# Patient Record
Sex: Male | Born: 1985 | Race: Black or African American | Hispanic: No | Marital: Single | Smoking: Former smoker
Health system: Southern US, Community
[De-identification: ages and names within clinical notes are randomized; demographics above are authoritative.]

## PROBLEM LIST (undated history)

## (undated) DIAGNOSIS — C801 Malignant (primary) neoplasm, unspecified: Secondary | ICD-10-CM

## (undated) DIAGNOSIS — N189 Chronic kidney disease, unspecified: Secondary | ICD-10-CM

## (undated) HISTORY — PX: TONSILLECTOMY: SUR1361

## (undated) HISTORY — PX: APPENDECTOMY: SHX54

---

## 2014-05-19 ENCOUNTER — Encounter (HOSPITAL_COMMUNITY): Payer: Self-pay | Admitting: Emergency Medicine

## 2014-05-19 ENCOUNTER — Emergency Department (HOSPITAL_COMMUNITY)
Admission: EM | Admit: 2014-05-19 | Discharge: 2014-05-19 | Disposition: A | Discharge status: Home / Self Care | Payer: 59 | Attending: Emergency Medicine | Admitting: Emergency Medicine

## 2014-05-19 ENCOUNTER — Emergency Department (HOSPITAL_COMMUNITY): Payer: 59

## 2014-05-19 DIAGNOSIS — M25572 Pain in left ankle and joints of left foot: Secondary | ICD-10-CM | POA: Insufficient documentation

## 2014-05-19 MED ORDER — OXYCODONE-ACETAMINOPHEN 5-325 MG PO TABS
1.0000 | ORAL_TABLET | Freq: Once | ORAL | Status: AC
Start: 2014-05-19 — End: 2014-05-19
  Administered 2014-05-19: 1 via ORAL
  Filled 2014-05-19: qty 1

## 2014-05-19 MED ORDER — IBUPROFEN 800 MG PO TABS: 800.0000 mg | ORAL_TABLET | Freq: Three times a day (TID) | ORAL | Status: DC

## 2014-05-19 MED ORDER — TRAMADOL HCL 50 MG PO TABS: 50.0000 mg | ORAL_TABLET | Freq: Four times a day (QID) | ORAL | Status: DC | PRN

## 2014-05-19 NOTE — Discharge Instructions (Signed)
Ankle Pain Ankle pain is a common symptom. The bones, cartilage, tendons, and muscles of the ankle joint perform a lot of work each day. The ankle joint holds your body weight and allows you to move around. Ankle pain can occur on either side or back of 1 or both ankles. Ankle pain may be sharp and burning or dull and aching. There may be tenderness, stiffness, redness, or warmth around the ankle. The pain occurs more often when a person walks or puts pressure on the ankle. CAUSES  There are many reasons ankle pain can develop. It is important to work with your caregiver to identify the cause since many conditions can impact the bones, cartilage, muscles, and tendons. Causes for ankle pain include:  Injury, including a break (fracture), sprain, or strain often due to a fall, sports, or a high-impact activity.  Swelling (inflammation) of a tendon (tendonitis).  Achilles tendon rupture.  Ankle instability after repeated sprains and strains.  Poor foot alignment.  Pressure on a nerve (tarsal tunnel syndrome).  Arthritis in the ankle or the lining of the ankle.  Crystal formation in the ankle (gout or pseudogout). DIAGNOSIS  A diagnosis is based on your medical history, your symptoms, results of your physical exam, and results of diagnostic tests. Diagnostic tests may include X-ray exams or a computerized magnetic scan (magnetic resonance imaging, MRI). TREATMENT  Treatment will depend on the cause of your ankle pain and may include:  Keeping pressure off the ankle and limiting activities.  Using crutches or other walking support (a cane or brace).  Using rest, ice, compression, and elevation.  Participating in physical therapy or home exercises.  Wearing shoe inserts or special shoes.  Losing weight.  Taking medications to reduce pain or swelling or receiving an injection.  Undergoing surgery. HOME CARE INSTRUCTIONS   Only take over-the-counter or prescription medicines for  pain, discomfort, or fever as directed by your caregiver.  Put ice on the injured area.  Put ice in a plastic bag.  Place a towel between your skin and the bag.  Leave the ice on for 15-20 minutes at a time, 03-04 times a day.  Keep your leg raised (elevated) when possible to lessen swelling.  Avoid activities that cause ankle pain.  Follow specific exercises as directed by your caregiver.  Record how often you have ankle pain, the location of the pain, and what it feels like. This information may be helpful to you and your caregiver.  Ask your caregiver about returning to work or sports and whether you should drive.  Follow up with your caregiver for further examination, therapy, or testing as directed. SEEK MEDICAL CARE IF:   Pain or swelling continues or worsens beyond 1 week.  You have an oral temperature above 102 F (38.9 C).  You are feeling unwell or have chills.  You are having an increasingly difficult time with walking.  You have loss of sensation or other new symptoms.  You have questions or concerns. MAKE SURE YOU:   Understand these instructions.  Will watch your condition.  Will get help right away if you are not doing well or get worse. Document Released: 06/28/2009 Document Revised: 04/02/2011 Document Reviewed: 06/28/2009 [facility] Patient Information 2015 [place], [place]. This information is not intended to replace advice given to you by your health care provider. Make sure you discuss any questions you have with your health care provider. Cryotherapy Cryotherapy means treatment with cold. Ice or gel packs can be used to  reduce both pain and swelling. Ice is the most helpful within the first 24 to 48 hours after an injury or flare-up from overusing a muscle or joint. Sprains, strains, spasms, burning pain, shooting pain, and aches can all be eased with ice. Ice can also be used when recovering from surgery. Ice is effective, has very few side effects,  and is safe for most people to use. PRECAUTIONS  Ice is not a safe treatment option for people with:  Raynaud phenomenon. This is a condition affecting small blood vessels in the extremities. Exposure to cold may cause your problems to return.  Cold hypersensitivity. There are many forms of cold hypersensitivity, including:  Cold urticaria. Red, itchy hives appear on the skin when the tissues begin to warm after being iced.  Cold erythema. This is a red, itchy rash caused by exposure to cold.  Cold hemoglobinuria. Red blood cells break down when the tissues begin to warm after being iced. The hemoglobin that carry oxygen are passed into the urine because they cannot combine with blood proteins fast enough.  Numbness or altered sensitivity in the area being iced. If you have any of the following conditions, do not use ice until you have discussed cryotherapy with your caregiver:  Heart conditions, such as arrhythmia, angina, or chronic heart disease.  High blood pressure.  Healing wounds or open skin in the area being iced.  Current infections.  Rheumatoid arthritis.  Poor circulation.  Diabetes. Ice slows the blood flow in the region it is applied. This is beneficial when trying to stop inflamed tissues from spreading irritating chemicals to surrounding tissues. However, if you expose your skin to cold temperatures for too long or without the proper protection, you can damage your skin or nerves. Watch for signs of skin damage due to cold. HOME CARE INSTRUCTIONS Follow these tips to use ice and cold packs safely.  Place a dry or damp towel between the ice and skin. A damp towel will cool the skin more quickly, so you may need to shorten the time that the ice is used.  For a more rapid response, add gentle compression to the ice.  Ice for no more than 10 to 20 minutes at a time. The bonier the area you are icing, the less time it will take to get the benefits of ice.  Check  your skin after 5 minutes to make sure there are no signs of a poor response to cold or skin damage.  Rest 20 minutes or more between uses.  Once your skin is numb, you can end your treatment. You can test numbness by very lightly touching your skin. The touch should be so light that you do not see the skin dimple from the pressure of your fingertip. When using ice, most people will feel these normal sensations in this order: cold, burning, aching, and numbness.  Do not use ice on someone who cannot communicate their responses to pain, such as small children or people with dementia. HOW TO MAKE AN ICE PACK Ice packs are the most common way to use ice therapy. Other methods include ice massage, ice baths, and cryosprays. Muscle creams that cause a cold, tingly feeling do not offer the same benefits that ice offers and should not be used as a substitute unless recommended by your caregiver. To make an ice pack, do one of the following:  Place crushed ice or a bag of frozen vegetables in a sealable plastic bag. Squeeze out the excess  air. Place this bag inside another plastic bag. Slide the bag into a pillowcase or place a damp towel between your skin and the bag.  Mix 3 parts water with 1 part rubbing alcohol. Freeze the mixture in a sealable plastic bag. When you remove the mixture from the freezer, it will be slushy. Squeeze out the excess air. Place this bag inside another plastic bag. Slide the bag into a pillowcase or place a damp towel between your skin and the bag. SEEK MEDICAL CARE IF:  You develop white spots on your skin. This may give the skin a blotchy (mottled) appearance.  Your skin turns blue or pale.  Your skin becomes waxy or hard.  Your swelling gets worse. MAKE SURE YOU:   Understand these instructions.  Will watch your condition.  Will get help right away if you are not doing well or get worse. Document Released: 09/04/2010 Document Revised: 05/25/2013 Document  Reviewed: 09/04/2010 [facility] Patient Information 2015 [place], [place]. This information is not intended to replace advice given to you by your health care provider. Make sure you discuss any questions you have with your health care provider.

## 2014-05-19 NOTE — ED Provider Notes (Signed)
CSN: [id number]     Arrival date & time 05/19/14  2013 History  This chart was scribed for non-physician provider Charlann Lange, PA-C, working with Leonard Schwartz, MD by Irene Pap, ED Scribe. This patient was seen in room [facility] and patient care was started at 9:37 PM.    Chief Complaint  Patient presents with  . Ankle Pain  . Foot Pain   The history is provided by the patient. No language interpreter was used.    HPI Comments: Rick Thomas is a 29 y.o. male who presents to the Emergency Department complaining of left ankle pain onset 3 weeks ago. He states that the pain radiates from the dorsal surface of his foot to the Achilles tendon. He reports that when the pain is worst, it radiates up through his left calf. He reports that the pain is increasingly getting worse, to the point where he cannot walk and it interferes with his work. He states that the foot intermittently swells and hurts throughout the day. He states that he has taken ibuprofen and tylenol to no relief. He denies any recent injury that could have caused this pain. He denies any significant medical history.   No past medical history on file. No past surgical history on file. No family history on file. History  Substance Use Topics  . Smoking status: Not on file  . Smokeless tobacco: Not on file  . Alcohol Use: Not on file    Review of Systems  Constitutional: Negative for fever and chills.  Gastrointestinal: Negative for nausea and vomiting.  Musculoskeletal: Positive for arthralgias.  Neurological: Negative for weakness and numbness.   Allergies  Review of patient's allergies indicates not on file.  Home Medications   Prior to Admission medications   Not on File   BP 109/90 mmHg  Pulse 52  Temp(Src) 98.6 F (37 C) (Oral)  Resp 21  SpO2 95%   Physical Exam  Constitutional: He is oriented to person, place, and time. He appears well-developed and well-nourished. No distress.  HENT:  Head:  Normocephalic and atraumatic.  Eyes: Conjunctivae and EOM are normal.  Neck: Normal range of motion. Neck supple.  Cardiovascular: Normal rate, regular rhythm and normal heart sounds.   Pulmonary/Chest: Effort normal and breath sounds normal.  Musculoskeletal: Normal range of motion. He exhibits no edema.       Left foot: There is no deformity.  Left ankle is essentially unremarkable in appearance; no bony abnormality; no discoloration; no significant swelling; joint stable.   Neurological: He is alert and oriented to person, place, and time.  Skin: Skin is warm and dry.  Psychiatric: He has a normal mood and affect. His behavior is normal.  Nursing note and vitals reviewed.   ED Course  Procedures (including critical care time) DIAGNOSTIC STUDIES: Oxygen Saturation is 95% on room air, normal by my interpretation.    COORDINATION OF CARE: 9:43 PM-Discussed treatment plan which includes pain medication and x-ray with pt at bedside and pt agreed to plan.   Labs Review Labs Reviewed - No data to display  Imaging Review Dg Ankle Complete Left  05/19/2014   CLINICAL DATA:  Subacute onset of left ankle pain. Dorsal foot pain radiates to the Achilles tendon. Intermittent difficulty walking. Initial encounter.  EXAM: LEFT ANKLE COMPLETE - 3+ VIEW  COMPARISON:  None.  FINDINGS: There is no evidence of fracture or dislocation. The ankle mortise is intact; the interosseous space is within normal limits. No talar tilt or subluxation  is seen.  The joint spaces are preserved. No significant soft tissue abnormalities are seen.  IMPRESSION: No evidence of fracture or dislocation. Given the persistent nature of the patient's symptoms, MRI could be considered for further evaluation, as deemed clinically appropriate.   Electronically Signed   By: Garald Balding M.D.   On: 05/19/2014 21:50    EKG Interpretation None      MDM   Final diagnoses:  None    1. Left ankle pain  Will treat  symptomatically and refer to orthopedics for further evaluation if pain persists.    I personally performed the services described in this documentation, which was scribed in my presence. The recorded information has been reviewed and is accurate.     Charlann Lange, PA-C 05/20/14 [id number]  Leonard Schwartz, MD 05/20/14 1630

## 2014-05-19 NOTE — ED Notes (Signed)
Pt states left ankle pain ongoing for 3 weeks, over the last 3 days pain has started to shoot up leg and cause occasional numbness to left feet. Pt states he's tried icing, elevating, and compression without any relief. States he can barely walk on it. 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 Pt states he didn't do anything to injure it that he's aware of. Hx of previous ankle injuries.

## 2017-02-07 ENCOUNTER — Other Ambulatory Visit: Payer: Self-pay

## 2017-02-07 ENCOUNTER — Emergency Department (HOSPITAL_BASED_OUTPATIENT_CLINIC_OR_DEPARTMENT_OTHER)
Admission: EM | Admit: 2017-02-07 | Discharge: 2017-02-08 | Disposition: A | Discharge status: Home / Self Care | Payer: Self-pay | Attending: Emergency Medicine | Admitting: Emergency Medicine

## 2017-02-07 ENCOUNTER — Encounter (HOSPITAL_BASED_OUTPATIENT_CLINIC_OR_DEPARTMENT_OTHER): Payer: Self-pay

## 2017-02-07 DIAGNOSIS — R0981 Nasal congestion: Secondary | ICD-10-CM | POA: Insufficient documentation

## 2017-02-07 DIAGNOSIS — R6889 Other general symptoms and signs: Secondary | ICD-10-CM

## 2017-02-07 DIAGNOSIS — R05 Cough: Secondary | ICD-10-CM | POA: Insufficient documentation

## 2017-02-07 DIAGNOSIS — M791 Myalgia, unspecified site: Secondary | ICD-10-CM | POA: Insufficient documentation

## 2017-02-07 DIAGNOSIS — J3489 Other specified disorders of nose and nasal sinuses: Secondary | ICD-10-CM | POA: Insufficient documentation

## 2017-02-07 DIAGNOSIS — F17210 Nicotine dependence, cigarettes, uncomplicated: Secondary | ICD-10-CM | POA: Insufficient documentation

## 2017-02-07 MED ORDER — IBUPROFEN 800 MG PO TABS
800.0000 mg | ORAL_TABLET | Freq: Once | ORAL | Status: AC
  Administered 2017-02-07: 800 mg via ORAL
  Filled 2017-02-07: qty 1

## 2017-02-07 NOTE — ED Triage Notes (Signed)
C/o flu like sx started last night-NAD-steady gait 

## 2017-02-08 ENCOUNTER — Encounter (HOSPITAL_BASED_OUTPATIENT_CLINIC_OR_DEPARTMENT_OTHER): Payer: Self-pay | Admitting: Emergency Medicine

## 2017-02-08 MED ORDER — OSELTAMIVIR PHOSPHATE 75 MG PO CAPS: 75.0000 mg | ORAL_CAPSULE | Freq: Two times a day (BID) | ORAL | 0 refills | Status: DC

## 2017-02-08 MED ORDER — IBUPROFEN 800 MG PO TABS: 800.0000 mg | ORAL_TABLET | Freq: Three times a day (TID) | ORAL | 0 refills | Status: DC

## 2017-02-08 NOTE — ED Provider Notes (Signed)
[facility] EMERGENCY DEPARTMENT Provider Note   CSN: [id number] Arrival date & time: 02/07/17  2148     History   Chief Complaint Chief Complaint  Patient presents with  . Cough    HPI Rick Thomas is a 32 y.o. male.  The history is provided by the patient.  Cough  This is a new problem. The current episode started 2 days ago. The problem occurs constantly. The problem has not changed since onset.The cough is non-productive. There has been no fever. Associated symptoms include rhinorrhea and myalgias. Pertinent negatives include no chest pain, no sweats, no weight loss and no shortness of breath. Associated symptoms comments: Myalgias runny nose. He has tried nothing for the symptoms. The treatment provided no relief. Risk factors: no influenza vaccine. His past medical history does not include bronchiectasis or COPD.    History reviewed. No pertinent past medical history.  There are no active problems to display for this patient.   Past Surgical History:  Procedure Laterality Date  . APPENDECTOMY         Home Medications    Prior to Admission medications   Medication Sig Start Date End Date Taking? Authorizing Provider  ibuprofen (ADVIL,MOTRIN) 800 MG tablet Take 1 tablet (800 mg total) by mouth 3 (three) times daily. 02/08/17   Tyeesha Riker, MD  oseltamivir (TAMIFLU) 75 MG capsule Take 1 capsule (75 mg total) by mouth every 12 (twelve) hours. 02/08/17   Khalif Stender, MD    Family History No family history on file.  Social History Social History   Tobacco Use  . Smoking status: Current Every Day Smoker  . Smokeless tobacco: Never Used  Substance Use Topics  . Alcohol use: No    Frequency: Never  . Drug use: No     Allergies   Amoxicillin and Penicillins   Review of Systems Review of Systems  Constitutional: Negative for diaphoresis and weight loss.  HENT: Positive for congestion and rhinorrhea.   Respiratory: Positive for cough.  Negative for shortness of breath.   Cardiovascular: Negative for chest pain and leg swelling.  Musculoskeletal: Positive for myalgias. Negative for neck pain.  All other systems reviewed and are negative.    Physical Exam Updated Vital Signs BP (!) 149/92 (BP Location: Left Arm)   Pulse 82   Temp 99.7 F (37.6 C) (Oral)   Resp 18   Ht 6' (1.829 m)   Wt 118.9 kg (262 lb 2 oz)   SpO2 99%   BMI 35.55 kg/m   Physical Exam  Constitutional: He is oriented to person, place, and time. He appears well-developed and well-nourished. No distress.  HENT:  Head: Normocephalic and atraumatic.  Nose: Nose normal.  Mouth/Throat: Oropharynx is clear and moist. No oropharyngeal exudate.  Eyes: Conjunctivae are normal. Pupils are equal, round, and reactive to light.  Neck: Normal range of motion. Neck supple.  Cardiovascular: Normal rate, regular rhythm, normal heart sounds and intact distal pulses.  Pulmonary/Chest: Effort normal and breath sounds normal. No stridor. No respiratory distress. He has no wheezes. He has no rales.  Abdominal: Soft. Bowel sounds are normal. He exhibits no mass. There is no tenderness. There is no rebound and no guarding.  Musculoskeletal: Normal range of motion.  Lymphadenopathy:    He has no cervical adenopathy.  Neurological: He is alert and oriented to person, place, and time. He displays normal reflexes.  Skin: Skin is warm and dry. Capillary refill takes less than 2 seconds.  ED Treatments / Results   Vitals:   02/07/17 2157 02/07/17 2316  BP: (!) 149/92   Pulse: 82   Resp: 18   Temp: (!) 100.9 F (38.3 C) 99.7 F (37.6 C)  SpO2: 99%     Procedures Procedures (including critical care time)  Medications Ordered in ED Medications  ibuprofen (ADVIL,MOTRIN) tablet 800 mg (800 mg Oral Given 02/07/17 2202)           Final Clinical Impressions(s) / ED Diagnoses   Final diagnoses:  Flu-like symptoms   Return for worsening pain, vomiting  blood inability to pass urine,  fevers > 100.4 unrelieved by medication, shortness of breath, intractable vomiting, or diarrhea, abdominal pain, Inability to tolerate liquids or food, cough, altered mental status or any concerns. No signs of systemic illness or infection. The patient is nontoxic-appearing on exam and vital signs are within normal limits.    I have reviewed the triage vital signs and the nursing notes. Pertinent labs &imaging results that were available during my care of the patient were reviewed by me and considered in my medical decision making (see chart for details).  After history, exam, and medical workup I feel the patient has been appropriately medically screened and is safe for discharge home. Pertinent diagnoses were discussed with the patient. Patient was given return precautions.   ED Discharge Orders        Ordered    oseltamivir (TAMIFLU) 75 MG capsule  Every 12 hours     02/08/17 0029    ibuprofen (ADVIL,MOTRIN) 800 MG tablet  3 times daily     02/08/17 0029       Addalynn Kumari, MD 02/08/17 0630

## 2017-03-30 ENCOUNTER — Emergency Department (HOSPITAL_COMMUNITY)
Admission: EM | Admit: 2017-03-30 | Discharge: 2017-03-31 | Disposition: A | Discharge status: Home / Self Care | Payer: Self-pay | Attending: Emergency Medicine | Admitting: Emergency Medicine

## 2017-03-30 ENCOUNTER — Other Ambulatory Visit: Payer: Self-pay

## 2017-03-30 ENCOUNTER — Emergency Department (HOSPITAL_COMMUNITY): Payer: Self-pay

## 2017-03-30 ENCOUNTER — Encounter (HOSPITAL_COMMUNITY): Payer: Self-pay | Admitting: Emergency Medicine

## 2017-03-30 DIAGNOSIS — Z79899 Other long term (current) drug therapy: Secondary | ICD-10-CM | POA: Insufficient documentation

## 2017-03-30 DIAGNOSIS — X58XXXA Exposure to other specified factors, initial encounter: Secondary | ICD-10-CM | POA: Insufficient documentation

## 2017-03-30 DIAGNOSIS — M25572 Pain in left ankle and joints of left foot: Secondary | ICD-10-CM | POA: Insufficient documentation

## 2017-03-30 DIAGNOSIS — Y990 Civilian activity done for income or pay: Secondary | ICD-10-CM | POA: Insufficient documentation

## 2017-03-30 DIAGNOSIS — Y939 Activity, unspecified: Secondary | ICD-10-CM | POA: Insufficient documentation

## 2017-03-30 DIAGNOSIS — Y9289 Other specified places as the place of occurrence of the external cause: Secondary | ICD-10-CM | POA: Insufficient documentation

## 2017-03-30 NOTE — ED Triage Notes (Signed)
L ankle pain x 1 month  Has seen no physician for same   Denies injury   Works in [employer] and had to leave work today

## 2017-03-31 MED ORDER — DICLOFENAC SODIUM 75 MG PO TBEC: 75.0000 mg | DELAYED_RELEASE_TABLET | Freq: Two times a day (BID) | ORAL | 0 refills | Status: DC

## 2017-03-31 NOTE — Discharge Instructions (Signed)
See Dr. Aline Brochure for evaluation.  Wear brace and do exercises. Try antiinflammatory medications.

## 2017-03-31 NOTE — ED Provider Notes (Signed)
[facility] EMERGENCY DEPARTMENT Provider Note   CSN: [id number] Arrival date & time: 03/30/17  2126     History   Chief Complaint Chief Complaint  Patient presents with  . Ankle Pain    x 1 month    HPI Rick Thomas is a 32 y.o. male.  The history is provided by the patient. No language interpreter was used.  Ankle Pain   Incident onset: 1 month. The incident occurred at work. There was no injury mechanism. The pain is present in the left ankle. The quality of the pain is described as aching. The pain is moderate. The pain has been constant since onset. He reports no foreign bodies present. He has tried nothing for the symptoms. The treatment provided no relief.  Pt reports he has pain in his ankle.  Pt reports it hurts to move foot up and down.  Pt has had pain for the past month.  No injury  History reviewed. No pertinent past medical history.  There are no active problems to display for this patient.   Past Surgical History:  Procedure Laterality Date  . APPENDECTOMY         Home Medications    Prior to Admission medications   Medication Sig Start Date End Date Taking? Authorizing Provider  diclofenac (VOLTAREN) 75 MG EC tablet Take 1 tablet (75 mg total) by mouth 2 (two) times daily. 03/31/17   Fransico Meadow, PA-C  ibuprofen (ADVIL,MOTRIN) 800 MG tablet Take 1 tablet (800 mg total) by mouth 3 (three) times daily. 02/08/17   Palumbo, April, MD  oseltamivir (TAMIFLU) 75 MG capsule Take 1 capsule (75 mg total) by mouth every 12 (twelve) hours. 02/08/17   Palumbo, April, MD    Family History History reviewed. No pertinent family history.  Social History Social History   Tobacco Use  . Smoking status: Former Research scientist (life sciences)  . Smokeless tobacco: Never Used  Substance Use Topics  . Alcohol use: No    Frequency: Never  . Drug use: No     Allergies   Amoxicillin and Penicillins   Review of Systems Review of Systems  All other systems reviewed and are  negative.    Physical Exam Updated Vital Signs BP (!) 143/95 (BP Location: Right Arm)   Pulse 79   Temp 98.3 F (36.8 C) (Oral)   Resp 16   Ht 6\' 1"  (1.854 m)   Wt 113.4 kg (250 lb)   SpO2 97%   BMI 32.98 kg/m   Physical Exam  Constitutional: He appears well-developed and well-nourished.  HENT:  Head: Normocephalic and atraumatic.  Eyes: Conjunctivae are normal.  Neck: Neck supple.  Cardiovascular: Normal rate and regular rhythm.  No murmur heard. Pulmonary/Chest: Effort normal and breath sounds normal. No respiratory distress.  Abdominal: Soft. There is no tenderness.  Musculoskeletal: Normal range of motion.  Neurological: He is alert.  Skin: Skin is warm and dry.  Psychiatric: He has a normal mood and affect.  Nursing note and vitals reviewed.    ED Treatments / Results  Labs (all labs ordered are listed, but only abnormal results are displayed) Labs Reviewed - No data to display  EKG  EKG Interpretation None       Radiology Dg Ankle Complete Left  Result Date: 03/30/2017 CLINICAL DATA:  Ankle pain for 1 month, no known injury, initial encounter EXAM: LEFT ANKLE COMPLETE - 3+ VIEW COMPARISON:  None. FINDINGS: There is no evidence of fracture, dislocation, or joint effusion. There is  no evidence of arthropathy or other focal bone abnormality. Soft tissues are unremarkable. IMPRESSION: No acute abnormality noted. Electronically Signed   By: Inez Catalina M.D.   On: 03/30/2017 23:47    Procedures Procedures (including critical care time)  Medications Ordered in ED Medications - No data to display   Initial Impression / Assessment and Plan / ED Course  I have reviewed the triage vital signs and the nursing notes.  Pertinent labs & imaging results that were available during my care of the patient were reviewed by me and considered in my medical decision making (see chart for details).     MDM  xrays no acute abnormality,  Pt placed in an aso,  Pt given  exercises.  Rx for voltaren.  Pt advised to follow up with Dr. Aline Brochure for evaluation.  Final Clinical Impressions(s) / ED Diagnoses   Final diagnoses:  Acute left ankle pain    ED Discharge Orders        Ordered    diclofenac (VOLTAREN) 75 MG EC tablet  2 times daily     03/31/17 0009    An After Visit Summary was printed and given to the patient.    Fransico Meadow, [place] 03/31/17 3212    Merryl Hacker, MD 03/31/17 [phone]

## 2017-07-10 ENCOUNTER — Encounter (HOSPITAL_COMMUNITY): Payer: Self-pay | Admitting: Emergency Medicine

## 2017-07-10 ENCOUNTER — Other Ambulatory Visit: Payer: Self-pay

## 2017-07-10 ENCOUNTER — Ambulatory Visit (HOSPITAL_COMMUNITY)
Admission: EM | Admit: 2017-07-10 | Discharge: 2017-07-10 | Disposition: A | Discharge status: Home / Self Care | Payer: Self-pay | Attending: Internal Medicine | Admitting: Internal Medicine

## 2017-07-10 DIAGNOSIS — R42 Dizziness and giddiness: Secondary | ICD-10-CM

## 2017-07-10 LAB — POCT I-STAT, CHEM 8
BUN: 13 mg/dL (ref 6–20)
CALCIUM ION: 1.19 mmol/L (ref 1.15–1.40)
Chloride: 105 mmol/L (ref 101–111)
Creatinine, Ser: 1 mg/dL (ref 0.61–1.24)
Glucose, Bld: 92 mg/dL (ref 65–99)
HEMATOCRIT: 47 % (ref 39.0–52.0)
HEMOGLOBIN: 16 g/dL (ref 13.0–17.0)
Potassium: 4.1 mmol/L (ref 3.5–5.1)
SODIUM: 140 mmol/L (ref 135–145)
TCO2: 23 mmol/L (ref 22–32)

## 2017-07-10 LAB — POCT URINALYSIS DIP (DEVICE)
Bilirubin Urine: NEGATIVE
GLUCOSE, UA: NEGATIVE mg/dL
Hgb urine dipstick: NEGATIVE
Ketones, ur: NEGATIVE mg/dL
Leukocytes, UA: NEGATIVE
NITRITE: NEGATIVE
PH: 7 (ref 5.0–8.0)
PROTEIN: NEGATIVE mg/dL
Specific Gravity, Urine: 1.02 (ref 1.005–1.030)
UROBILINOGEN UA: 1 mg/dL (ref 0.0–1.0)

## 2017-07-10 NOTE — ED Provider Notes (Signed)
[place]    CSN: [id number] Arrival date & time: 07/10/17  1223     History   Chief Complaint Chief Complaint  Patient presents with  . Dizziness    HPI Rick Thomas is a 32 y.o. male no significant past medical history presenting today for evaluation of dizziness.  Patient states that he woke up this morning and has had progressively worsening lightheadedness.  Also feeling weak and fatigued.  Denies room spinning sensation.  Denies associated nausea or vomiting.  States he does not drink a lot of fluids.  Also has a poor diet.  Patient also has had intermittent chest pain over the past 2 months.  He had an episode while waiting in the lobby.  States that they will occur while he is at rest or with exertion.  No associated shortness of breath, nausea, vomiting.  Patient does not have a history of hypertension or diabetes.  He does have significant heart history in his family, but denies death from a heart attack at an early age.  Denies recreational drug use.  Will smoke 2 cigarettes/day.  Patient does not have a PCP.  HPI  History reviewed. No pertinent past medical history.  There are no active problems to display for this patient.   Past Surgical History:  Procedure Laterality Date  . APPENDECTOMY         Home Medications    Prior to Admission medications   Medication Sig Start Date End Date Taking? Authorizing Provider  diclofenac (VOLTAREN) 75 MG EC tablet Take 1 tablet (75 mg total) by mouth 2 (two) times daily. 03/31/17   Fransico Meadow, PA-C  ibuprofen (ADVIL,MOTRIN) 800 MG tablet Take 1 tablet (800 mg total) by mouth 3 (three) times daily. 02/08/17   Palumbo, April, MD  oseltamivir (TAMIFLU) 75 MG capsule Take 1 capsule (75 mg total) by mouth every 12 (twelve) hours. 02/08/17   Palumbo, April, MD    Family History Family History  Problem Relation Age of Onset  . Hypertension Mother   . Hypertension Father     Social History Social History    Tobacco Use  . Smoking status: Former Research scientist (life sciences)  . Smokeless tobacco: Never Used  Substance Use Topics  . Alcohol use: No    Frequency: Never  . Drug use: No     Allergies   Amoxicillin and Penicillins   Review of Systems Review of Systems  Constitutional: Positive for fatigue. Negative for activity change, appetite change and fever.  HENT: Negative for congestion and sore throat.   Eyes: Negative for visual disturbance.  Respiratory: Negative for cough and shortness of breath.   Cardiovascular: Positive for chest pain. Negative for palpitations and leg swelling.  Gastrointestinal: Negative for abdominal pain, nausea and vomiting.  Genitourinary: Negative for decreased urine volume and dysuria.  Musculoskeletal: Negative for myalgias.  Skin: Negative for rash.  Neurological: Positive for dizziness and light-headedness. Negative for speech difficulty, weakness and numbness.     Physical Exam Triage Vital Signs ED Triage Vitals  Enc Vitals Group     BP 07/10/17 1328 (!) 134/96     Pulse Rate 07/10/17 1328 65     Resp 07/10/17 1328 18     Temp 07/10/17 1328 98.3 F (36.8 C)     Temp Source 07/10/17 1328 Oral     SpO2 07/10/17 1328 98 %     Weight --      Height --      Head Circumference --  Peak Flow --      Pain Score 07/10/17 1326 5     Pain Loc --      Pain Edu? --      Excl. in [place]? --    Orthostatic VS for the past 24 hrs:  BP- Lying Pulse- Lying BP- Sitting Pulse- Sitting BP- Standing at 0 minutes Pulse- Standing at 0 minutes  07/10/17 1405 (!) 147/98 59 (!) 153/96 67 (!) 148/96 76    Updated Vital Signs BP (!) 134/96 (BP Location: Left Arm) Comment (BP Location): large cuff  Pulse 65   Temp 98.3 F (36.8 C) (Oral)   Resp 18   SpO2 98%   Visual Acuity Right Eye Distance:   Left Eye Distance:   Bilateral Distance:    Right Eye Near:   Left Eye Near:    Bilateral Near:     Physical Exam  Constitutional: He is oriented to person, place, and  time. He appears well-developed and well-nourished.  HENT:  Head: Normocephalic and atraumatic.  Mouth/Throat: Oropharynx is clear and moist.  Eyes: Pupils are equal, round, and reactive to light. Conjunctivae and EOM are normal.  Neck: Neck supple.  Cardiovascular: Normal rate and regular rhythm.  No murmur heard. Pulmonary/Chest: Effort normal and breath sounds normal. No respiratory distress.  Chest nontender to palpation  Abdominal: Soft. There is no tenderness.  Musculoskeletal: He exhibits no edema.  Neurological: He is alert and oriented to person, place, and time.  Patient A&O x3, cranial nerves II-XII grossly intact, strength at shoulders, hips and knees 5/5, equal bilaterally, patellar reflex 2+ bilaterally.Negative Romberg and Pronator Drift. Gait without abnormality.   Skin: Skin is warm and dry.  Psychiatric: He has a normal mood and affect.  Nursing note and vitals reviewed.    UC Treatments / Results  Labs (all labs ordered are listed, but only abnormal results are displayed) Labs Reviewed  POCT URINALYSIS DIP (DEVICE)  POCT I-STAT, CHEM 8    EKG None  Radiology No results found.  Procedures Procedures (including critical care time)  Medications Ordered in UC Medications - No data to display  Initial Impression / Assessment and Plan / UC Course  I have reviewed the triage vital signs and the nursing notes.  Pertinent labs & imaging results that were available during my care of the patient were reviewed by me and considered in my medical decision making (see chart for details).    EKG normal sinus rhythm, nonspecific T wave inversions in V1, otherwise noticed signs of acute ischemia or infarction.  I-STAT and UA unremarkable.  Negative orthostatics.  At this time will recommend general recommendations of hydration, healthier diet.  Discussed getting established with PCP for further heart risk stratification and follow-up.Discussed strict return  precautions. Patient verbalized understanding and is agreeable with plan.  Final Clinical Impressions(s) / UC Diagnoses   Final diagnoses:  Lightheadedness     Discharge Instructions     Please read attached instructions about ways to decrease symptoms  Please drink at least 8- 10 cups of water a day  Try to eat a healthier diet incorporating more fruits and vegetables and less lprocessed foods  Follow up if symptoms not improving, worsening, chest pain becoming more frequent, more intense, worsening shortness of breath, changes in vision, or weakness    ED Prescriptions    None     Controlled Substance Prescriptions [place] Controlled Substance Registry consulted? Not Applicable   Janith Lima, [place] 07/10/17 1933

## 2017-07-10 NOTE — ED Triage Notes (Signed)
Onset of lightheadedness today, this morning.  Chest pain in lobby.  Random chest pain for 2 months, intermittent.  Pain is no different today

## 2017-07-10 NOTE — Discharge Instructions (Signed)
Please read attached instructions about ways to decrease symptoms  Please drink at least 8- 10 cups of water a day  Try to eat a healthier diet incorporating more fruits and vegetables and less lprocessed foods  Follow up if symptoms not improving, worsening, chest pain becoming more frequent, more intense, worsening shortness of breath, changes in vision, or weakness

## 2018-07-02 ENCOUNTER — Emergency Department (HOSPITAL_COMMUNITY)
Admission: EM | Admit: 2018-07-02 | Discharge: 2018-07-02 | Disposition: A | Discharge status: Home / Self Care | Payer: Self-pay | Attending: Emergency Medicine | Admitting: Emergency Medicine

## 2018-07-02 ENCOUNTER — Other Ambulatory Visit: Payer: Self-pay

## 2018-07-02 DIAGNOSIS — K029 Dental caries, unspecified: Secondary | ICD-10-CM | POA: Insufficient documentation

## 2018-07-02 DIAGNOSIS — K047 Periapical abscess without sinus: Secondary | ICD-10-CM | POA: Insufficient documentation

## 2018-07-02 DIAGNOSIS — Z87891 Personal history of nicotine dependence: Secondary | ICD-10-CM | POA: Insufficient documentation

## 2018-07-02 MED ORDER — ETODOLAC 300 MG PO CAPS: 300.0000 mg | ORAL_CAPSULE | Freq: Three times a day (TID) | ORAL | 0 refills | Status: DC

## 2018-07-02 MED ORDER — CLINDAMYCIN HCL 300 MG PO CAPS: 300.0000 mg | ORAL_CAPSULE | Freq: Three times a day (TID) | ORAL | 0 refills | Status: DC

## 2018-07-02 NOTE — Discharge Instructions (Signed)
Please review the discharge instructions.  There is some information in there to help you with finding a dentist.  Contact one as soon as possible.  Take the medications as prescribed.  Return as needed for worsening symptoms

## 2018-07-02 NOTE — ED Provider Notes (Signed)
[facility] DEPT Provider Note   CSN: [id number] Arrival date & time: 07/02/18  1311    History   Chief Complaint No chief complaint on file.   HPI Rick Thomas is a 33 y.o. male.     HPI Patient presents to the emergency room with complaints of facial swelling.  Patient has had a cavity in his right lower molar region for a while.  Patient has not really had any trouble with it until this morning.  He woke up and his face was swollen and he had pain.  He denies any fevers or chills.  He denies any difficulty swallowing or speaking.  Patient does not have a dentist and was not sure where to go. No past medical history on file.  There are no active problems to display for this patient.   Past Surgical History:  Procedure Laterality Date  . APPENDECTOMY          Home Medications    Prior to Admission medications   Medication Sig Start Date End Date Taking? Authorizing Provider  clindamycin (CLEOCIN) 300 MG capsule Take 1 capsule (300 mg total) by mouth 3 (three) times daily. 07/02/18   Dorie Rank, MD  etodolac (LODINE) 300 MG capsule Take 1 capsule (300 mg total) by mouth every 8 (eight) hours. 07/02/18   Dorie Rank, MD  ibuprofen (ADVIL,MOTRIN) 800 MG tablet Take 1 tablet (800 mg total) by mouth 3 (three) times daily. 02/08/17   Palumbo, April, MD  oseltamivir (TAMIFLU) 75 MG capsule Take 1 capsule (75 mg total) by mouth every 12 (twelve) hours. 02/08/17   Palumbo, April, MD    Family History Family History  Problem Relation Age of Onset  . Hypertension Mother   . Hypertension Father     Social History Social History   Tobacco Use  . Smoking status: Former Research scientist (life sciences)  . Smokeless tobacco: Never Used  Substance Use Topics  . Alcohol use: No    Frequency: Never  . Drug use: No     Allergies   Amoxicillin and Penicillins   Review of Systems Review of Systems  All other systems reviewed and are negative.    Physical Exam  Updated Vital Signs BP (!) 159/82 (BP Location: Right Arm)   Pulse 66   Temp 98.2 F (36.8 C) (Oral)   Resp 20   Ht 1.829 m (6')   Wt 110.7 kg   SpO2 98%   BMI 33.09 kg/m   Physical Exam Vitals signs and nursing note reviewed.  Constitutional:      General: He is not in acute distress.    Appearance: He is well-developed.  HENT:     Head: Normocephalic and atraumatic.     Right Ear: External ear normal.     Left Ear: External ear normal.     Mouth/Throat:     Comments: Obvious dental carry with decayed tooth tooth #29, no purulent drainage, small amount of edema noted in the right mandibular region of the face, no trismus, no neck swelling Eyes:     General: No scleral icterus.       Right eye: No discharge.        Left eye: No discharge.     Conjunctiva/sclera: Conjunctivae normal.  Neck:     Musculoskeletal: Neck supple.     Trachea: No tracheal deviation.  Cardiovascular:     Rate and Rhythm: Normal rate.  Pulmonary:     Effort: Pulmonary effort is normal. No respiratory  distress.     Breath sounds: No stridor.  Abdominal:     General: There is no distension.  Musculoskeletal:        General: No swelling or deformity.  Skin:    General: Skin is warm and dry.     Findings: No rash.  Neurological:     Mental Status: He is alert.     Cranial Nerves: Cranial nerve deficit: no gross deficits.      ED Treatments / Results   Procedures Procedures (including critical care time)  Medications Ordered in ED Medications - No data to display   Initial Impression / Assessment and Plan / ED Course  I have reviewed the triage vital signs and the nursing notes.  Pertinent labs & imaging results that were available during my care of the patient were reviewed by me and considered in my medical decision making (see chart for details).   Patient has obvious dental caries and what appears to be a dental abscess.  No focal area of fluctuance that would be amenable to  incision and drainage.  Patient will be prescribed antibiotics.  Discussed the importance of close follow-up with a dentist as soon as possible.  There is no dentist on call today for referral.  I will provide him with a dental resource guide  Final Clinical Impressions(s) / ED Diagnoses   Final diagnoses:  Dental abscess  Dental caries    ED Discharge Orders         Ordered    etodolac (LODINE) 300 MG capsule  Every 8 hours    Note to Pharmacy:  As needed for pain   07/02/18 1608    clindamycin (CLEOCIN) 300 MG capsule  3 times daily     07/02/18 1608           Dorie Rank, MD 07/02/18 [phone]

## 2018-07-05 ENCOUNTER — Other Ambulatory Visit: Payer: Self-pay | Admitting: *Deleted

## 2018-07-05 DIAGNOSIS — Z20822 Contact with and (suspected) exposure to covid-19: Secondary | ICD-10-CM

## 2018-07-07 NOTE — Addendum Note (Signed)
Addended by: Brigitte Pulse on: 07/07/2018 09:15 AM   Modules accepted: Orders

## 2018-08-08 ENCOUNTER — Other Ambulatory Visit: Payer: Self-pay

## 2018-08-08 DIAGNOSIS — Z20822 Contact with and (suspected) exposure to covid-19: Secondary | ICD-10-CM

## 2018-08-13 LAB — NOVEL CORONAVIRUS, NAA: SARS-CoV-2, NAA: NOT DETECTED

## 2020-07-29 ENCOUNTER — Emergency Department (HOSPITAL_COMMUNITY)
Admission: EM | Admit: 2020-07-29 | Discharge: 2020-07-30 | Disposition: A | Discharge status: Home / Self Care | Payer: Commercial Managed Care - PPO | Attending: Emergency Medicine | Admitting: Emergency Medicine

## 2020-07-29 ENCOUNTER — Encounter (HOSPITAL_COMMUNITY): Payer: Self-pay | Admitting: Emergency Medicine

## 2020-07-29 ENCOUNTER — Other Ambulatory Visit: Payer: Self-pay

## 2020-07-29 DIAGNOSIS — R109 Unspecified abdominal pain: Secondary | ICD-10-CM | POA: Diagnosis not present

## 2020-07-29 DIAGNOSIS — F17210 Nicotine dependence, cigarettes, uncomplicated: Secondary | ICD-10-CM | POA: Insufficient documentation

## 2020-07-29 DIAGNOSIS — N2889 Other specified disorders of kidney and ureter: Secondary | ICD-10-CM

## 2020-07-29 LAB — BASIC METABOLIC PANEL
Anion gap: 11 (ref 5–15)
BUN: 11 mg/dL (ref 6–20)
CO2: 26 mmol/L (ref 22–32)
Calcium: 9.4 mg/dL (ref 8.9–10.3)
Chloride: 104 mmol/L (ref 98–111)
Creatinine, Ser: 1.1 mg/dL (ref 0.61–1.24)
GFR, Estimated: >60 mL/min (ref >60)
Glucose, Bld: 95 mg/dL (ref 70–99)
Potassium: 3.7 mmol/L (ref 3.5–5.1)
Sodium: 141 mmol/L (ref 135–145)

## 2020-07-29 LAB — URINALYSIS, ROUTINE W REFLEX MICROSCOPIC
Bilirubin Urine: NEGATIVE
Glucose, UA: NEGATIVE mg/dL
Hgb urine dipstick: NEGATIVE
Ketones, ur: 5 mg/dL — AB
Leukocytes,Ua: NEGATIVE
Nitrite: NEGATIVE
Protein, ur: NEGATIVE mg/dL
Specific Gravity, Urine: 1.019 (ref 1.005–1.030)
pH: 5 (ref 5.0–8.0)

## 2020-07-29 LAB — CBC
HCT: 47.8 % (ref 39.0–52.0)
Hemoglobin: 16.2 g/dL (ref 13.0–17.0)
MCH: 32.1 pg (ref 26.0–34.0)
MCHC: 33.9 g/dL (ref 30.0–36.0)
MCV: 94.7 fL (ref 80.0–100.0)
Platelets: 274 10*3/uL (ref 150–400)
RBC: 5.05 MIL/uL (ref 4.22–5.81)
RDW: 12.8 % (ref 11.5–15.5)
WBC: 10.8 10*3/uL — ABNORMAL HIGH (ref 4.0–10.5)
nRBC: 0 % (ref 0.0–0.2)

## 2020-07-29 MED ORDER — OXYCODONE HCL 5 MG PO TABS
5.0000 mg | ORAL_TABLET | Freq: Once | ORAL | Status: AC
  Administered 2020-07-30: 5 mg via ORAL
  Filled 2020-07-29: qty 1

## 2020-07-29 NOTE — ED Provider Notes (Signed)
[facility] DEPT Provider Note   CSN: [id number] Arrival date & time: 07/29/20  1613     History Chief Complaint  Patient presents with   Flank Pain    Ayman Brull is a 35 y.o. male.  The history is provided by the patient.  Flank Pain This is a new problem. The current episode started more than 1 week ago. The problem occurs daily. Pertinent negatives include no chest pain, no abdominal pain, no headaches and no shortness of breath. Nothing aggravates the symptoms. Nothing relieves the symptoms. He has tried nothing for the symptoms. The treatment provided no relief.      History reviewed. No pertinent past medical history.  There are no problems to display for this patient.   Past Surgical History:  Procedure Laterality Date   APPENDECTOMY         Family History  Problem Relation Age of Onset   Hypertension Mother    Hypertension Father     Social History   Tobacco Use   Smoking status: Some Days    Pack years: 0.00    Types: Cigarettes   Smokeless tobacco: Never  Substance Use Topics   Alcohol use: Not Currently    Comment: 8 liquor drinks per week   Drug use: No    Home Medications Prior to Admission medications   Medication Sig Start Date End Date Taking? Authorizing Provider  clindamycin (CLEOCIN) 300 MG capsule Take 1 capsule (300 mg total) by mouth 3 (three) times daily. 07/02/18   Dorie Rank, MD  etodolac (LODINE) 300 MG capsule Take 1 capsule (300 mg total) by mouth every 8 (eight) hours. 07/02/18   Dorie Rank, MD  ibuprofen (ADVIL,MOTRIN) 800 MG tablet Take 1 tablet (800 mg total) by mouth 3 (three) times daily. 02/08/17   Palumbo, April, MD  oseltamivir (TAMIFLU) 75 MG capsule Take 1 capsule (75 mg total) by mouth every 12 (twelve) hours. 02/08/17   Palumbo, April, MD    Allergies    Amoxicillin and Penicillins  Review of Systems   Review of Systems  Constitutional:  Negative for chills and fever.  HENT:   Negative for ear pain and sore throat.   Eyes:  Negative for pain and visual disturbance.  Respiratory:  Negative for cough and shortness of breath.   Cardiovascular:  Negative for chest pain and palpitations.  Gastrointestinal:  Negative for abdominal pain and vomiting.  Genitourinary:  Positive for flank pain. Negative for dysuria and hematuria.  Musculoskeletal:  Negative for arthralgias and back pain.  Skin:  Negative for color change and rash.  Neurological:  Negative for seizures, syncope and headaches.  All other systems reviewed and are negative.  Physical Exam Updated Vital Signs BP (!) 136/94   Pulse (!) 49   Temp 98.7 F (37.1 C) (Oral)   Resp 16   Ht 6\' 1"  (1.854 m)   Wt 113.4 kg   SpO2 97%   BMI 32.98 kg/m   Physical Exam Vitals and nursing note reviewed.  Constitutional:      General: He is not in acute distress.    Appearance: He is well-developed. He is not ill-appearing.  HENT:     Head: Normocephalic and atraumatic.     Nose: Nose normal.  Eyes:     Extraocular Movements: Extraocular movements intact.     Conjunctiva/sclera: Conjunctivae normal.     Pupils: Pupils are equal, round, and reactive to light.  Cardiovascular:     Rate and  Rhythm: Normal rate and regular rhythm.     Pulses: Normal pulses.     Heart sounds: Normal heart sounds. No murmur heard. Pulmonary:     Effort: Pulmonary effort is normal. No respiratory distress.     Breath sounds: Normal breath sounds.  Abdominal:     Palpations: Abdomen is soft.     Tenderness: There is no abdominal tenderness. There is left CVA tenderness.  Musculoskeletal:     Cervical back: Neck supple.  Skin:    General: Skin is warm and dry.     Capillary Refill: Capillary refill takes less than 2 seconds.  Neurological:     General: No focal deficit present.     Mental Status: He is alert.    ED Results / Procedures / Treatments   Labs (all labs ordered are listed, but only abnormal results are  displayed) Labs Reviewed  URINALYSIS, ROUTINE W REFLEX MICROSCOPIC - Abnormal; Notable for the following components:      Result Value   Ketones, ur 5 (*)    All other components within normal limits  CBC - Abnormal; Notable for the following components:   WBC 10.8 (*)    All other components within normal limits  BASIC METABOLIC PANEL    EKG None  Radiology No results found.  Procedures Procedures   Medications Ordered in ED Medications  oxyCODONE (Oxy IR/ROXICODONE) immediate release tablet 5 mg (has no administration in time range)    ED Course  I have reviewed the triage vital signs and the nursing notes.  Pertinent labs & imaging results that were available during my care of the patient were reviewed by me and considered in my medical decision making (see chart for details).    MDM Rules/Calculators/A&P                          Nelson Noone is here with left flank pain.  Been about a week or so.  Denies any history of kidney stones.  Denies any pain with urination.  Denies any specific trauma.  Suspect may be kidney infection versus kidney stone versus musculoskeletal process.  We will get basic labs and CT scan of abdomen pelvis.  Patient pending CT scan abdomen pelvis.  If unremarkable suspect likely musculoskeletal process.  Please see oncoming ED staff note for further results, evaluation, disposition patient.  This chart was dictated using voice recognition software.  Despite best efforts to proofread,  errors can occur which can change the documentation meaning.   Final Clinical Impression(s) / ED Diagnoses Final diagnoses:  Left flank pain    Rx / DC Orders ED Discharge Orders     None        Lennice Sites, DO 07/30/20 0015

## 2020-07-29 NOTE — ED Provider Notes (Signed)
Emergency Medicine Provider Triage Evaluation Note  Rick Thomas , a 35 y.o. male  was evaluated in triage.  Pt complains of left flank pain/left lower back pain, intermittent dysuria.  Review of Systems  Positive: Left flank pain, left lower back pain Negative: dysuria  Physical Exam  BP (!) 159/100 (BP Location: Left Arm)   Pulse 60   Temp 98.7 F (37.1 C) (Oral)   Resp 18   Ht 6\' 1"  (1.854 m)   Wt 113.4 kg   SpO2 98%   BMI 32.98 kg/m  Gen:   Awake, no distress   Resp:  Normal effort  MSK:   Moves extremities without difficulty  Other:    Medical Decision Making  Medically screening exam initiated at 5:33 PM.  Appropriate orders placed.  Emiel Kielty was informed that the remainder of the evaluation will be completed by another provider, this initial triage assessment does not replace that evaluation, and the importance of remaining in the ED until their evaluation is complete.     Bishop Dublin 07/29/20 1738    Milton Ferguson, MD 07/31/20 [phone]

## 2020-07-30 ENCOUNTER — Emergency Department (HOSPITAL_COMMUNITY): Payer: Commercial Managed Care - PPO

## 2020-07-30 MED ORDER — ONDANSETRON 4 MG PO TBDP
4.0000 mg | ORAL_TABLET | Freq: Once | ORAL | Status: AC
  Administered 2020-07-30: 4 mg via ORAL
  Filled 2020-07-30: qty 1

## 2020-07-30 MED ORDER — OXYCODONE-ACETAMINOPHEN 5-325 MG PO TABS
1.0000 | ORAL_TABLET | Freq: Once | ORAL | Status: AC
  Administered 2020-07-30: 1 via ORAL
  Filled 2020-07-30: qty 1

## 2020-07-30 MED ORDER — CIPROFLOXACIN HCL 500 MG PO TABS
500.0000 mg | ORAL_TABLET | Freq: Once | ORAL | Status: AC
  Administered 2020-07-30: 500 mg via ORAL
  Filled 2020-07-30: qty 1

## 2020-07-30 MED ORDER — CIPROFLOXACIN HCL 500 MG PO TABS: 500.0000 mg | ORAL_TABLET | Freq: Two times a day (BID) | ORAL | 0 refills | Status: DC

## 2020-07-30 MED ORDER — ONDANSETRON 4 MG PO TBDP
4.0000 mg | ORAL_TABLET | Freq: Three times a day (TID) | ORAL | 0 refills | Status: DC | PRN
Start: 2020-07-30 — End: 2020-09-16

## 2020-07-30 MED ORDER — OXYCODONE-ACETAMINOPHEN 5-325 MG PO TABS: 1.0000 | ORAL_TABLET | Freq: Three times a day (TID) | ORAL | 0 refills | Status: DC | PRN

## 2020-07-30 NOTE — ED Provider Notes (Signed)
35 year old male received a signout from Dr. Ronnald Nian pending CT stone study.  Per his HPI:  "Rick Thomas is a 35 y.o. male.   The history is provided by the patient.  Flank Pain This is a new problem. The current episode started more than 1 week ago. The problem occurs daily. Pertinent negatives include no chest pain, no abdominal pain, no headaches and no shortness of breath. Nothing aggravates the symptoms. Nothing relieves the symptoms. He has tried nothing for the symptoms. The treatment provided no relief."    Physical Exam  BP 137/82   Pulse (!) 51   Temp 98.7 F (37.1 C) (Oral)   Resp 16   Ht 6\' 1"  (1.854 m)   Wt 113.4 kg   SpO2 98%   BMI 32.98 kg/m   Physical Exam Vitals and nursing note reviewed.  Constitutional:      Appearance: He is well-developed.  HENT:     Head: Normocephalic.  Eyes:     Conjunctiva/sclera: Conjunctivae normal.  Cardiovascular:     Rate and Rhythm: Normal rate and regular rhythm.     Heart sounds: No murmur heard. Pulmonary:     Effort: Pulmonary effort is normal.  Abdominal:     General: There is no distension.     Palpations: Abdomen is soft. There is no mass.     Tenderness: There is abdominal tenderness. There is left CVA tenderness. There is no right CVA tenderness, guarding or rebound.     Hernia: No hernia is present.  Musculoskeletal:     Cervical back: Neck supple.  Skin:    General: Skin is warm and dry.  Neurological:     Mental Status: He is alert.  Psychiatric:        Behavior: Behavior normal.    ED Course/Procedures   Clinical Course as of 07/30/20 0500  Sat Jul 30, 2020  0228 Discussed CT results with the patient.  He does report that he has been having intermittent fevers and chills for the last 2 weeks.  T-max 101. [MM]    Clinical Course User Index [MM] Brenly Trawick A, PA-C    Procedures  MDM  35 year old male received a signout from Dr. Ronnald Nian pending CT stone study.  Please see his note for further  work-up and medical decision making.  Vital signs are stable.  CT stone study with  5.2 x3.8 exophytic lobulated mass lesion arising from the inferior aspect of the left kidney posteriorly with some central decreased attenuation.  This is concerning for underlying neoplasm, but could also consider renal abscess.  When I reevaluated the patient, he reports he has been having intermittent fever and chills for the last 2 weeks.  T-max up to 101.  Renal ultrasound obtained for further evaluation.  Impression renal ultrasound with predominantly solid mass with a renal neoplasm must again be excluded, but since there is a cystic component there is still the possibility of an abscess.  Discussed the patient with Dr. Matilde Sprang, urology, he recommends starting the patient on a 5-day course of ciprofloxacin, 500 mg twice daily, and having the patient follow-up in the urology clinic.  Discussed this plan with the patient he is agreeable at this time.  We will send him home with a short course of pain medication.  He is agreeable with this plan.  ER return precautions given.  He is hemodynamically stable and in no acute distress.  Safe for discharge home with outpatient follow-up as discussed.  Joline Maxcy A, PA-C 07/30/20 0500    Veryl Speak, MD 07/30/20 2300

## 2020-07-30 NOTE — Discharge Instructions (Addendum)
Thank you for allowing me to care for you today in the Emergency Department.   Please call [facility] to schedule a follow-up appointment.  Let them know that you are following up from an ER visit.  Their office information is listed above.  Take 650 mg of Tylenol or 600 mg of ibuprofen with food every 6 hours for pain.  You can alternate between these 2 medications every 3 hours if your pain returns.  For instance, you can take Tylenol at noon, followed by a dose of ibuprofen at 3, followed by second dose of Tylenol and 6.  For severe, uncontrollable pain, you can take 1 tablet of Percocet every 8 hours.  This medication is a narcotic.  It can be addicting.  Do not use it while you are working or driving as it causes you to be impaired.  Do not take more than 4000 mg of Tylenol in a 24-hour period.  Let 1 tablet of Zofran dissolve in your tongue every 8 hours as needed for nausea or vomiting.  Return to the emergency department if you develop severe, uncontrollable abdominal pain, persistent fevers with significantly worsening pain, uncontrollable vomiting, if you become unable to urinate, or have other new, concerning symptoms.

## 2020-08-25 ENCOUNTER — Other Ambulatory Visit: Payer: Self-pay | Admitting: Urology

## 2020-08-26 NOTE — Progress Notes (Signed)
Pt. Needs orders for upcoming surgery.PAT and labs on 08/29/20.

## 2020-08-26 NOTE — Patient Instructions (Addendum)
DUE TO COVID-19 ONLY ONE VISITOR IS ALLOWED TO COME WITH YOU AND STAY IN THE WAITING ROOM ONLY DURING PRE OP AND PROCEDURE DAY OF SURGERY. THE 1 VISITOR  MAY VISIT WITH YOU AFTER SURGERY IN YOUR PRIVATE ROOM DURING VISITING HOURS ONLY!  YOU NEED TO HAVE A COVID 19 TEST ON: 09/14/20, THIS TEST MUST BE DONE BEFORE SURGERY,  COVID TESTING SITE: [address].[place]. Phone: [phone]. No appointment needed. Open M - F 8 am - 3 pm. It's a drive-up.               Rick Thomas   Your procedure is scheduled on: 09/16/20   Report to [facility] Main  Entrance   Report to admitting at : 9:00 AM     Call this number if you have problems the morning of surgery [phone]    Remember: Do not eat solid food :After Midnight. Clear liquids until: 8:00 am.  CLEAR LIQUID DIET  Foods Allowed                                                                     Foods Excluded  Coffee and tea, regular and decaf                             liquids that you cannot  Plain Jell-O any favor except red or purple                                           see through such as: Fruit ices (not with fruit pulp)                                     milk, soups, orange juice  Iced Popsicles                                    All solid food Carbonated beverages, regular and diet                                    Cranberry, grape and apple juices Sports drinks like Gatorade Lightly seasoned clear broth or consume(fat free) Sugar, honey syrup  Sample Menu Breakfast                                Lunch                                     Supper Cranberry juice                    Beef broth  Chicken broth Jell-O                                     Grape juice                           Apple juice Coffee or tea                        Jell-O                                      Popsicle                                                Coffee or tea                         Coffee or tea  _____________________________________________________________________  BRUSH YOUR TEETH MORNING OF SURGERY AND RINSE YOUR MOUTH OUT, NO CHEWING GUM CANDY OR MINTS.    Take these medicines the morning of surgery with A SIP OF WATER: N/A                               You may not have any metal on your body including hair pins and              piercings  Do not wear jewelry, lotions, powders or perfumes, deodorant             Men may shave face and neck.   Do not bring valuables to the hospital. [place].  Contacts, dentures or bridgework may not be worn into surgery.  Leave suitcase in the car. After surgery it may be brought to your room.     Patients discharged the day of surgery will not be allowed to drive home. IF YOU ARE HAVING SURGERY AND GOING HOME THE SAME DAY, YOU MUST HAVE AN ADULT TO DRIVE YOU HOME AND BE WITH YOU FOR 24 HOURS. YOU MAY GO HOME BY TAXI OR UBER OR ORTHERWISE, BUT AN ADULT MUST ACCOMPANY YOU HOME AND STAY WITH YOU FOR 24 HOURS.  Name and phone number of your driver:  Special Instructions: N/A              Please read over the following fact sheets you were given: ____________________________________________________________________           [facility] - Preparing for Surgery Before surgery, you can play an important role.  Because skin is not sterile, your skin needs to be as free of germs as possible.  You can reduce the number of germs on your skin by washing with CHG (chlorahexidine gluconate) soap before surgery.  CHG is an antiseptic cleaner which kills germs and bonds with the skin to continue killing germs even after washing. Please DO NOT use if you have an allergy to CHG or antibacterial soaps.  If your skin becomes reddened/irritated stop using the CHG and inform  your nurse when you arrive at Short Stay. Do not shave (including legs and underarms) for at least 48 hours prior to the first  CHG shower.  You may shave your face/neck. Please follow these instructions carefully:  1.  Shower with CHG Soap the night before surgery and the  morning of Surgery.  2.  If you choose to wash your hair, wash your hair first as usual with your  normal  shampoo.  3.  After you shampoo, rinse your hair and body thoroughly to remove the  shampoo.                           4.  Use CHG as you would any other liquid soap.  You can apply chg directly  to the skin and wash                       Gently with a scrungie or clean washcloth.  5.  Apply the CHG Soap to your body ONLY FROM THE NECK DOWN.   Do not use on face/ open                           Wound or open sores. Avoid contact with eyes, ears mouth and genitals (private parts).                       Wash face,  Genitals (private parts) with your normal soap.             6.  Wash thoroughly, paying special attention to the area where your surgery  will be performed.  7.  Thoroughly rinse your body with warm water from the neck down.  8.  DO NOT shower/wash with your normal soap after using and rinsing off  the CHG Soap.                9.  Pat yourself dry with a clean towel.            10.  Wear clean pajamas.            11.  Place clean sheets on your bed the night of your first shower and do not  sleep with pets. Day of Surgery : Do not apply any lotions/deodorants the morning of surgery.  Please wear clean clothes to the hospital/surgery center.  FAILURE TO FOLLOW THESE INSTRUCTIONS MAY RESULT IN THE CANCELLATION OF YOUR SURGERY PATIENT SIGNATURE_________________________________  NURSE SIGNATURE__________________________________  ________________________________________________________________________

## 2020-08-29 ENCOUNTER — Other Ambulatory Visit: Payer: Self-pay

## 2020-08-29 ENCOUNTER — Encounter (HOSPITAL_COMMUNITY): Payer: Self-pay

## 2020-08-29 ENCOUNTER — Encounter (HOSPITAL_COMMUNITY)
Admission: RE | Admit: 2020-08-29 | Discharge: 2020-08-29 | Disposition: A | Discharge status: Home / Self Care | Payer: Commercial Managed Care - PPO | Source: Ambulatory Visit | Attending: Urology | Admitting: Urology

## 2020-08-29 DIAGNOSIS — Z01812 Encounter for preprocedural laboratory examination: Secondary | ICD-10-CM | POA: Diagnosis present

## 2020-08-29 HISTORY — DX: Malignant (primary) neoplasm, unspecified: C80.1

## 2020-08-29 HISTORY — DX: Chronic kidney disease, unspecified: N18.9

## 2020-08-29 LAB — CBC
HCT: 46.1 % (ref 39.0–52.0)
Hemoglobin: 15.5 g/dL (ref 13.0–17.0)
MCH: 32 pg (ref 26.0–34.0)
MCHC: 33.6 g/dL (ref 30.0–36.0)
MCV: 95.2 fL (ref 80.0–100.0)
Platelets: 259 10*3/uL (ref 150–400)
RBC: 4.84 MIL/uL (ref 4.22–5.81)
RDW: 12.6 % (ref 11.5–15.5)
WBC: 6.2 10*3/uL (ref 4.0–10.5)
nRBC: 0 % (ref 0.0–0.2)

## 2020-08-29 NOTE — Progress Notes (Addendum)
COVID Vaccine Completed: Date COVID Vaccine completed: COVID vaccine manufacturer: [employer]ley   PCP - No  Cardiologist -   Chest x-ray -  EKG -  Stress Test -  ECHO -  Cardiac Cath -  Pacemaker/ICD device last checked:  Sleep Study -  CPAP -   Fasting Blood Sugar -  Checks Blood Sugar _____ times a day  Blood Thinner Instructions: Aspirin Instructions: Last Dose:  Anesthesia review: smoker  Patient denies shortness of breath, fever, cough and chest pain at PAT appointment   Patient verbalized understanding of instructions that were given to them at the PAT appointment. Patient was also instructed that they will need to review over the PAT instructions again at home before surgery.

## 2020-09-07 ENCOUNTER — Other Ambulatory Visit: Payer: Self-pay | Admitting: Urology

## 2020-09-14 ENCOUNTER — Other Ambulatory Visit: Payer: Self-pay | Admitting: Urology

## 2020-09-15 LAB — SARS CORONAVIRUS 2 (TAT 6-24 HRS): SARS Coronavirus 2: NEGATIVE

## 2020-09-16 ENCOUNTER — Inpatient Hospital Stay (HOSPITAL_COMMUNITY)
Admission: RE | Admit: 2020-09-16 | Discharge: 2020-09-18 | DRG: 658 | Disposition: A | Discharge status: Home / Self Care | Payer: Commercial Managed Care - PPO | Source: Ambulatory Visit | Attending: Urology | Admitting: Urology

## 2020-09-16 ENCOUNTER — Encounter (HOSPITAL_COMMUNITY): Payer: Self-pay | Admitting: Urology

## 2020-09-16 ENCOUNTER — Encounter (HOSPITAL_COMMUNITY)
Admission: RE | Disposition: A | Discharge status: Home / Self Care | Payer: Self-pay | Source: Ambulatory Visit | Attending: Urology

## 2020-09-16 ENCOUNTER — Other Ambulatory Visit: Payer: Self-pay

## 2020-09-16 ENCOUNTER — Inpatient Hospital Stay (HOSPITAL_COMMUNITY): Payer: Commercial Managed Care - PPO | Admitting: Certified Registered"

## 2020-09-16 DIAGNOSIS — Z20822 Contact with and (suspected) exposure to covid-19: Secondary | ICD-10-CM | POA: Diagnosis present

## 2020-09-16 DIAGNOSIS — Z6833 Body mass index (BMI) 33.0-33.9, adult: Secondary | ICD-10-CM

## 2020-09-16 DIAGNOSIS — E669 Obesity, unspecified: Secondary | ICD-10-CM | POA: Diagnosis present

## 2020-09-16 DIAGNOSIS — F17210 Nicotine dependence, cigarettes, uncomplicated: Secondary | ICD-10-CM | POA: Diagnosis present

## 2020-09-16 DIAGNOSIS — N2889 Other specified disorders of kidney and ureter: Secondary | ICD-10-CM | POA: Diagnosis present

## 2020-09-16 DIAGNOSIS — C642 Malignant neoplasm of left kidney, except renal pelvis: Secondary | ICD-10-CM | POA: Diagnosis present

## 2020-09-16 DIAGNOSIS — Z8249 Family history of ischemic heart disease and other diseases of the circulatory system: Secondary | ICD-10-CM | POA: Diagnosis not present

## 2020-09-16 DIAGNOSIS — Z880 Allergy status to penicillin: Secondary | ICD-10-CM | POA: Diagnosis not present

## 2020-09-16 DIAGNOSIS — Z8051 Family history of malignant neoplasm of kidney: Secondary | ICD-10-CM | POA: Diagnosis not present

## 2020-09-16 HISTORY — PX: ROBOT ASSISTED LAPAROSCOPIC NEPHRECTOMY: SHX5140

## 2020-09-16 HISTORY — PX: LYMPH NODE DISSECTION: SHX5087

## 2020-09-16 LAB — ABO/RH: ABO/RH(D): A POS

## 2020-09-16 LAB — TYPE AND SCREEN
ABO/RH(D): A POS
Antibody Screen: NEGATIVE

## 2020-09-16 SURGERY — NEPHRECTOMY, RADICAL, ROBOT-ASSISTED, LAPAROSCOPIC, ADULT
Anesthesia: General | Site: Renal | Laterality: Left

## 2020-09-16 MED ORDER — MIDAZOLAM HCL 2 MG/2ML IJ SOLN
INTRAMUSCULAR | Status: AC
  Filled 2020-09-16: qty 2

## 2020-09-16 MED ORDER — PROPOFOL 10 MG/ML IV BOLUS
INTRAVENOUS | Status: DC | PRN
  Administered 2020-09-16: 200 mg via INTRAVENOUS

## 2020-09-16 MED ORDER — ACETAMINOPHEN 10 MG/ML IV SOLN
INTRAVENOUS | Status: AC
  Filled 2020-09-16: qty 100

## 2020-09-16 MED ORDER — AMISULPRIDE (ANTIEMETIC) 5 MG/2ML IV SOLN: 10.0000 mg | Freq: Once | INTRAVENOUS | Status: DC | PRN

## 2020-09-16 MED ORDER — BACITRACIN-NEOMYCIN-POLYMYXIN 400-5-5000 EX OINT: 1.0000 "application " | TOPICAL_OINTMENT | Freq: Three times a day (TID) | CUTANEOUS | Status: DC | PRN

## 2020-09-16 MED ORDER — ONDANSETRON HCL 4 MG/2ML IJ SOLN
INTRAMUSCULAR | Status: DC | PRN
  Administered 2020-09-16: 4 mg via INTRAVENOUS

## 2020-09-16 MED ORDER — ONDANSETRON HCL 4 MG/2ML IJ SOLN
INTRAMUSCULAR | Status: AC
  Filled 2020-09-16: qty 2

## 2020-09-16 MED ORDER — LIDOCAINE 2% (20 MG/ML) 5 ML SYRINGE
INTRAMUSCULAR | Status: DC | PRN
  Administered 2020-09-16: 60 mg via INTRAVENOUS

## 2020-09-16 MED ORDER — OXYCODONE HCL 5 MG PO TABS
5.0000 mg | ORAL_TABLET | Freq: Once | ORAL | Status: AC | PRN
  Administered 2020-09-16: 5 mg via ORAL

## 2020-09-16 MED ORDER — KETAMINE HCL 10 MG/ML IJ SOLN
INTRAMUSCULAR | Status: AC
  Filled 2020-09-16: qty 1

## 2020-09-16 MED ORDER — POLYETHYLENE GLYCOL 3350 17 GM/SCOOP PO POWD: 1.0000 | Freq: Once | ORAL | Status: DC

## 2020-09-16 MED ORDER — HYDROMORPHONE HCL 1 MG/ML IJ SOLN
0.5000 mg | INTRAMUSCULAR | Status: DC | PRN
  Administered 2020-09-16 (×2): 1 mg via INTRAVENOUS
  Filled 2020-09-16 (×3): qty 1

## 2020-09-16 MED ORDER — GLYCOPYRROLATE 0.2 MG/ML IJ SOLN
INTRAMUSCULAR | Status: DC | PRN
Start: 2020-09-16 — End: 2020-09-16
  Administered 2020-09-16: .2 mg via INTRAVENOUS

## 2020-09-16 MED ORDER — ONDANSETRON HCL 4 MG/2ML IJ SOLN
4.0000 mg | INTRAMUSCULAR | Status: DC | PRN
  Administered 2020-09-16: 4 mg via INTRAVENOUS
  Filled 2020-09-16: qty 2

## 2020-09-16 MED ORDER — HYDRALAZINE HCL 10 MG PO TABS: 10.0000 mg | ORAL_TABLET | Freq: Four times a day (QID) | ORAL | Status: DC | PRN

## 2020-09-16 MED ORDER — FENTANYL CITRATE (PF) 250 MCG/5ML IJ SOLN
INTRAMUSCULAR | Status: AC
  Filled 2020-09-16: qty 5

## 2020-09-16 MED ORDER — LACTATED RINGERS IV SOLN: INTRAVENOUS | Status: DC

## 2020-09-16 MED ORDER — DOCUSATE SODIUM 100 MG PO CAPS: 100.0000 mg | ORAL_CAPSULE | Freq: Two times a day (BID) | ORAL | Status: DC

## 2020-09-16 MED ORDER — PROMETHAZINE HCL 25 MG/ML IJ SOLN: 6.2500 mg | INTRAMUSCULAR | Status: DC | PRN

## 2020-09-16 MED ORDER — OXYCODONE HCL 5 MG/5ML PO SOLN: 5.0000 mg | Freq: Once | ORAL | Status: AC | PRN

## 2020-09-16 MED ORDER — STERILE WATER FOR IRRIGATION IR SOLN
Status: DC | PRN
  Administered 2020-09-16: 1000 mL

## 2020-09-16 MED ORDER — LACTATED RINGERS IV SOLN: INTRAVENOUS | Status: DC | PRN

## 2020-09-16 MED ORDER — LACTATED RINGERS IR SOLN
Status: DC | PRN
  Administered 2020-09-16: 1000 mL

## 2020-09-16 MED ORDER — PHENYLEPHRINE HCL (PRESSORS) 10 MG/ML IV SOLN
INTRAVENOUS | Status: AC
  Filled 2020-09-16: qty 2

## 2020-09-16 MED ORDER — CIPROFLOXACIN IN D5W 400 MG/200ML IV SOLN
400.0000 mg | Freq: Once | INTRAVENOUS | Status: AC
  Administered 2020-09-16: 400 mg via INTRAVENOUS
  Filled 2020-09-16: qty 200

## 2020-09-16 MED ORDER — SODIUM CHLORIDE (PF) 0.9 % IJ SOLN
INTRAMUSCULAR | Status: DC | PRN
  Administered 2020-09-16: 10 mL

## 2020-09-16 MED ORDER — ORAL CARE MOUTH RINSE: 15.0000 mL | Freq: Once | OROMUCOSAL | Status: AC

## 2020-09-16 MED ORDER — BUPIVACAINE LIPOSOME 1.3 % IJ SUSP
20.0000 mL | Freq: Once | INTRAMUSCULAR | Status: AC
  Administered 2020-09-16: 20 mL
  Filled 2020-09-16: qty 20

## 2020-09-16 MED ORDER — DEXAMETHASONE SODIUM PHOSPHATE 10 MG/ML IJ SOLN
INTRAMUSCULAR | Status: DC | PRN
  Administered 2020-09-16: 10 mg via INTRAVENOUS

## 2020-09-16 MED ORDER — LIDOCAINE 2% (20 MG/ML) 5 ML SYRINGE
INTRAMUSCULAR | Status: AC
  Filled 2020-09-16: qty 15

## 2020-09-16 MED ORDER — BELLADONNA ALKALOIDS-OPIUM 16.2-60 MG RE SUPP
1.0000 | Freq: Four times a day (QID) | RECTAL | Status: DC | PRN
  Administered 2020-09-16: 1 via RECTAL
  Filled 2020-09-16: qty 1

## 2020-09-16 MED ORDER — FENTANYL CITRATE PF 50 MCG/ML IJ SOSY
25.0000 ug | PREFILLED_SYRINGE | INTRAMUSCULAR | Status: DC | PRN
  Administered 2020-09-16 (×3): 50 ug via INTRAVENOUS

## 2020-09-16 MED ORDER — SUGAMMADEX SODIUM 500 MG/5ML IV SOLN
INTRAVENOUS | Status: AC
  Filled 2020-09-16: qty 5

## 2020-09-16 MED ORDER — MIDAZOLAM HCL 5 MG/5ML IJ SOLN
INTRAMUSCULAR | Status: DC | PRN
  Administered 2020-09-16 (×2): 2 mg via INTRAVENOUS

## 2020-09-16 MED ORDER — LIDOCAINE 2% (20 MG/ML) 5 ML SYRINGE
INTRAMUSCULAR | Status: DC | PRN
  Administered 2020-09-16: 1.5 mg/kg/h via INTRAVENOUS

## 2020-09-16 MED ORDER — OXYCODONE HCL 5 MG PO TABS
ORAL_TABLET | ORAL | Status: AC
  Filled 2020-09-16: qty 1

## 2020-09-16 MED ORDER — SUGAMMADEX SODIUM 500 MG/5ML IV SOLN
INTRAVENOUS | Status: DC | PRN
  Administered 2020-09-16: 230 mg via INTRAVENOUS

## 2020-09-16 MED ORDER — PHENYLEPHRINE HCL-NACL 20-0.9 MG/250ML-% IV SOLN
INTRAVENOUS | Status: DC | PRN
  Administered 2020-09-16: 15 ug/min via INTRAVENOUS

## 2020-09-16 MED ORDER — ACETAMINOPHEN 500 MG PO TABS
1000.0000 mg | ORAL_TABLET | Freq: Four times a day (QID) | ORAL | Status: AC
  Administered 2020-09-16 – 2020-09-17 (×4): 1000 mg via ORAL
  Filled 2020-09-16 (×4): qty 2

## 2020-09-16 MED ORDER — OXYCODONE HCL 5 MG PO TABS
5.0000 mg | ORAL_TABLET | ORAL | Status: DC | PRN
  Administered 2020-09-17 – 2020-09-18 (×5): 5 mg via ORAL
  Filled 2020-09-16 (×5): qty 1

## 2020-09-16 MED ORDER — DIPHENHYDRAMINE HCL 50 MG/ML IJ SOLN: 12.5000 mg | Freq: Four times a day (QID) | INTRAMUSCULAR | Status: DC | PRN

## 2020-09-16 MED ORDER — PHENYLEPHRINE 40 MCG/ML (10ML) SYRINGE FOR IV PUSH (FOR BLOOD PRESSURE SUPPORT)
PREFILLED_SYRINGE | INTRAVENOUS | Status: DC | PRN
  Administered 2020-09-16: 40 ug via INTRAVENOUS
  Administered 2020-09-16: 80 ug via INTRAVENOUS
  Administered 2020-09-16 (×3): 40 ug via INTRAVENOUS

## 2020-09-16 MED ORDER — CLINDAMYCIN PHOSPHATE 900 MG/50ML IV SOLN
900.0000 mg | Freq: Once | INTRAVENOUS | Status: AC
  Administered 2020-09-16: 900 mg via INTRAVENOUS
  Filled 2020-09-16: qty 50

## 2020-09-16 MED ORDER — CHLORHEXIDINE GLUCONATE 0.12 % MT SOLN
15.0000 mL | Freq: Once | OROMUCOSAL | Status: AC
  Administered 2020-09-16: 15 mL via OROMUCOSAL

## 2020-09-16 MED ORDER — ROCURONIUM BROMIDE 100 MG/10ML IV SOLN
INTRAVENOUS | Status: DC | PRN
  Administered 2020-09-16: 60 mg via INTRAVENOUS
  Administered 2020-09-16 (×2): 20 mg via INTRAVENOUS

## 2020-09-16 MED ORDER — DIPHENHYDRAMINE HCL 12.5 MG/5ML PO ELIX: 12.5000 mg | ORAL_SOLUTION | Freq: Four times a day (QID) | ORAL | Status: DC | PRN

## 2020-09-16 MED ORDER — PROPOFOL 10 MG/ML IV BOLUS
INTRAVENOUS | Status: AC
  Filled 2020-09-16: qty 20

## 2020-09-16 MED ORDER — SODIUM CHLORIDE (PF) 0.9 % IJ SOLN
INTRAMUSCULAR | Status: AC
  Filled 2020-09-16: qty 20

## 2020-09-16 MED ORDER — OXYCODONE-ACETAMINOPHEN 5-325 MG PO TABS: 1.0000 | ORAL_TABLET | Freq: Four times a day (QID) | ORAL | 0 refills | Status: DC | PRN

## 2020-09-16 MED ORDER — ACETAMINOPHEN 10 MG/ML IV SOLN
1000.0000 mg | Freq: Once | INTRAVENOUS | Status: DC | PRN
  Administered 2020-09-16: 1000 mg via INTRAVENOUS

## 2020-09-16 MED ORDER — FENTANYL CITRATE PF 50 MCG/ML IJ SOSY
PREFILLED_SYRINGE | INTRAMUSCULAR | Status: AC
  Filled 2020-09-16: qty 3

## 2020-09-16 MED ORDER — KETAMINE HCL 10 MG/ML IJ SOLN
INTRAMUSCULAR | Status: DC | PRN
  Administered 2020-09-16 (×5): 10 mg via INTRAVENOUS

## 2020-09-16 MED ORDER — DEXTROSE-NACL 5-0.45 % IV SOLN
INTRAVENOUS | Status: DC
Start: 2020-09-16 — End: 2020-09-18

## 2020-09-16 MED ORDER — FENTANYL CITRATE (PF) 100 MCG/2ML IJ SOLN
INTRAMUSCULAR | Status: DC | PRN
  Administered 2020-09-16 (×4): 50 ug via INTRAVENOUS

## 2020-09-16 MED ORDER — DOCUSATE SODIUM 100 MG PO CAPS
100.0000 mg | ORAL_CAPSULE | Freq: Two times a day (BID) | ORAL | Status: DC
  Administered 2020-09-16 – 2020-09-18 (×4): 100 mg via ORAL
  Filled 2020-09-16 (×4): qty 1

## 2020-09-16 SURGICAL SUPPLY — 61 items
BAG COUNTER SPONGE SURGICOUNT (BAG) ×3 IMPLANT
BAG LAPAROSCOPIC 12 15 PORT 16 (BASKET) ×2 IMPLANT
BAG RETRIEVAL 12/15 (BASKET) ×3
CHLORAPREP W/TINT 26 (MISCELLANEOUS) ×3 IMPLANT
CLIP LIGATING HEMO LOK XL GOLD (MISCELLANEOUS) ×6 IMPLANT
CLIP LIGATING HEMO O LOK GREEN (MISCELLANEOUS) ×6 IMPLANT
CLIP VESOLOCK LG 6/CT PURPLE (CLIP) ×3 IMPLANT
COVER SURGICAL LIGHT HANDLE (MISCELLANEOUS) ×3 IMPLANT
COVER TIP SHEARS 8 DVNC (MISCELLANEOUS) ×2 IMPLANT
COVER TIP SHEARS 8MM DA VINCI (MISCELLANEOUS) ×1
CUTTER ECHEON FLEX ENDO 45 340 (ENDOMECHANICALS) ×3 IMPLANT
DECANTER SPIKE VIAL GLASS SM (MISCELLANEOUS) ×3 IMPLANT
DERMABOND ADVANCED (GAUZE/BANDAGES/DRESSINGS) ×1
DERMABOND ADVANCED .7 DNX12 (GAUZE/BANDAGES/DRESSINGS) ×2 IMPLANT
DRAIN CHANNEL 15F RND FF 3/16 (WOUND CARE) IMPLANT
DRAPE ARM DVNC X/XI (DISPOSABLE) ×8 IMPLANT
DRAPE COLUMN DVNC XI (DISPOSABLE) ×2 IMPLANT
DRAPE DA VINCI XI ARM (DISPOSABLE) ×4
DRAPE DA VINCI XI COLUMN (DISPOSABLE) ×1
DRAPE INCISE IOBAN 66X45 STRL (DRAPES) ×3 IMPLANT
DRAPE SHEET LG 3/4 BI-LAMINATE (DRAPES) ×3 IMPLANT
ELECT PENCIL ROCKER SW 15FT (MISCELLANEOUS) ×3 IMPLANT
ELECT REM PT RETURN 15FT ADLT (MISCELLANEOUS) ×3 IMPLANT
EVACUATOR SILICONE 100CC (DRAIN) IMPLANT
GAUZE 4X4 16PLY ~~LOC~~+RFID DBL (SPONGE) ×3 IMPLANT
GLOVE SURG ENC MOIS LTX SZ6.5 (GLOVE) ×3 IMPLANT
GLOVE SURG ENC TEXT LTX SZ7.5 (GLOVE) ×6 IMPLANT
GOWN STRL REUS W/TWL LRG LVL3 (GOWN DISPOSABLE) ×9 IMPLANT
HOLDER FOLEY CATH W/STRAP (MISCELLANEOUS) ×3 IMPLANT
IRRIG SUCT STRYKERFLOW 2 WTIP (MISCELLANEOUS) ×3
IRRIGATION SUCT STRKRFLW 2 WTP (MISCELLANEOUS) ×2 IMPLANT
IV LACTATED RINGERS 1000ML (IV SOLUTION) ×3 IMPLANT
KIT BASIN OR (CUSTOM PROCEDURE TRAY) ×3 IMPLANT
KIT TURNOVER KIT A (KITS) ×3 IMPLANT
LOOP VESSEL MAXI BLUE (MISCELLANEOUS) ×3 IMPLANT
NEEDLE INSUFFLATION 14GA 120MM (NEEDLE) ×3 IMPLANT
PENCIL SMOKE EVACUATOR (MISCELLANEOUS) IMPLANT
PORT ACCESS TROCAR AIRSEAL 12 (TROCAR) ×2 IMPLANT
PORT ACCESS TROCAR AIRSEAL 5M (TROCAR) ×1
PROTECTOR NERVE ULNAR (MISCELLANEOUS) ×6 IMPLANT
SEAL CANN UNIV 5-8 DVNC XI (MISCELLANEOUS) ×8 IMPLANT
SEAL XI 5MM-8MM UNIVERSAL (MISCELLANEOUS) ×4
SET TRI-LUMEN FLTR TB AIRSEAL (TUBING) ×3 IMPLANT
SOL ANTI FOG 6CC (MISCELLANEOUS) ×2 IMPLANT
SOLUTION ANTI FOG 6CC (MISCELLANEOUS) ×1
SOLUTION ELECTROLUBE (MISCELLANEOUS) ×3 IMPLANT
SPONGE T-LAP 4X18 ~~LOC~~+RFID (SPONGE) ×3 IMPLANT
STAPLE RELOAD 45 WHT (STAPLE) ×6 IMPLANT
STAPLE RELOAD 45MM WHITE (STAPLE) ×3
SUT ETHILON 3 0 PS 1 (SUTURE) IMPLANT
SUT MNCRL AB 4-0 PS2 18 (SUTURE) ×6 IMPLANT
SUT PDS AB 1 CT1 27 (SUTURE) ×9 IMPLANT
SUT VICRYL 0 UR6 27IN ABS (SUTURE) IMPLANT
TOWEL OR 17X26 10 PK STRL BLUE (TOWEL DISPOSABLE) ×3 IMPLANT
TOWEL OR NON WOVEN STRL DISP B (DISPOSABLE) ×3 IMPLANT
TRAY FOLEY MTR SLVR 16FR STAT (SET/KITS/TRAYS/PACK) ×3 IMPLANT
TRAY LAPAROSCOPIC (CUSTOM PROCEDURE TRAY) ×3 IMPLANT
TROCAR BLADELESS OPT 5 100 (ENDOMECHANICALS) IMPLANT
TROCAR UNIVERSAL OPT 12M 100M (ENDOMECHANICALS) ×3 IMPLANT
TROCAR XCEL 12X100 BLDLESS (ENDOMECHANICALS) ×3 IMPLANT
WATER STERILE IRR 1000ML POUR (IV SOLUTION) ×3 IMPLANT

## 2020-09-16 NOTE — Anesthesia Procedure Notes (Signed)
Arterial Line Insertion Start/End8/26/2022 10:00 AM, 09/16/2020 10:10 AM Performed by: Murvin Natal, MD, anesthesiologist  Patient location: Pre-op. Preanesthetic checklist: patient identified, IV checked, site marked, risks and benefits discussed, surgical consent, monitors and equipment checked, pre-op evaluation, timeout performed and anesthesia consent Lidocaine 1% used for infiltration and patient sedated Left, radial was placed Catheter size: 20 G Hand hygiene performed , maximum sterile barriers used  and Seldinger technique used  Attempts: 1 Procedure performed using ultrasound guided technique. Ultrasound Notes:anatomy identified, needle tip was noted to be adjacent to the nerve/plexus identified and no ultrasound evidence of intravascular and/or intraneural injection Following insertion, dressing applied and Biopatch. Post procedure assessment: normal and unchanged  Patient tolerated the procedure well with no immediate complications.

## 2020-09-16 NOTE — Plan of Care (Signed)
  Problem: Health Behavior/Discharge Planning: Goal: Ability to manage health-related needs will improve Outcome: Progressing   Problem: Clinical Measurements: Goal: Will remain free from infection Outcome: Progressing   

## 2020-09-16 NOTE — Transfer of Care (Signed)
Immediate Anesthesia Transfer of Care Note  Patient: Rick Thomas  Procedure(s) Performed: XI ROBOTIC ASSISTED LAPAROSCOPIC RADICAL NEPHRECTOMY (Left: Renal) RETROPERITONEAL LYMPH NODE DISSECTION (Left: Abdomen)  Patient Location: PACU  Anesthesia Type:General  Level of Consciousness: awake, alert  and oriented  Airway & Oxygen Therapy: Patient Spontanous Breathing and Patient connected to face mask oxygen  Post-op Assessment: Report given to RN, Post -op Vital signs reviewed and stable and Patient moving all extremities X 4  Post vital signs: Reviewed and stable  Last Vitals:  Vitals Value Taken Time  BP 143/95 09/16/20 1438  Temp    Pulse 53 09/16/20 1438  Resp 18 09/16/20 1438  SpO2 100 % 09/16/20 1438  Vitals shown include unvalidated device data.  Last Pain:  Vitals:   09/16/20 0936  TempSrc:   PainSc: 0-No pain         Complications: No notable events documented.

## 2020-09-16 NOTE — Anesthesia Postprocedure Evaluation (Signed)
Anesthesia Post Note  Patient: Rick Thomas  Procedure(s) Performed: XI ROBOTIC ASSISTED LAPAROSCOPIC RADICAL NEPHRECTOMY (Left: Renal) RETROPERITONEAL LYMPH NODE DISSECTION (Left: Abdomen)     Patient location during evaluation: PACU Anesthesia Type: General Level of consciousness: awake and alert Pain management: pain level controlled Vital Signs Assessment: post-procedure vital signs reviewed and stable Respiratory status: spontaneous breathing, nonlabored ventilation and respiratory function stable Cardiovascular status: blood pressure returned to baseline and stable Postop Assessment: no apparent nausea or vomiting Anesthetic complications: no   No notable events documented.  Last Vitals:  Vitals:   09/16/20 1600 09/16/20 1623  BP: (!) 187/107 (!) 191/105  Pulse: (!) 58 (!) 53  Resp:  (!) 22  Temp: 36.5 C 36.7 C  SpO2: 100%     Last Pain:  Vitals:   09/16/20 1623  TempSrc: Oral  PainSc:                  Lidia Collum

## 2020-09-16 NOTE — Anesthesia Preprocedure Evaluation (Addendum)
Anesthesia Evaluation  Patient identified by MRN, date of birth, ID band Patient awake    Reviewed: Allergy & Precautions, NPO status , Patient's Chart, lab work & pertinent test results  Airway Mallampati: I  TM Distance: >3 FB Neck ROM: Full    Dental no notable dental hx.    Pulmonary Current Smoker and Patient abstained from smoking.,    Pulmonary exam normal breath sounds clear to auscultation       Cardiovascular negative cardio ROS Normal cardiovascular exam Rhythm:Regular Rate:Normal     Neuro/Psych negative neurological ROS  negative psych ROS   GI/Hepatic negative GI ROS, Neg liver ROS,   Endo/Other  negative endocrine ROS  Renal/GU Renal disease     Musculoskeletal negative musculoskeletal ROS (+)   Abdominal (+) + obese,   Peds  Hematology negative hematology ROS (+)   Anesthesia Other Findings LEFT RENAL MASS  Reproductive/Obstetrics                            Anesthesia Physical Anesthesia Plan  ASA: 2  Anesthesia Plan: General   Post-op Pain Management:    Induction: Intravenous  PONV Risk Score and Plan: 2 and Ondansetron, Dexamethasone, Midazolam and Treatment may vary due to age or medical condition  Airway Management Planned: Oral ETT  Additional Equipment: Arterial line  Intra-op Plan:   Post-operative Plan: Extubation in OR  Informed Consent: I have reviewed the patients History and Physical, chart, labs and discussed the procedure including the risks, benefits and alternatives for the proposed anesthesia with the patient or authorized representative who has indicated his/her understanding and acceptance.     Dental advisory given  Plan Discussed with: CRNA  Anesthesia Plan Comments:        Anesthesia Quick Evaluation

## 2020-09-16 NOTE — Anesthesia Procedure Notes (Signed)
Procedure Name: Intubation Date/Time: 09/16/2020 11:52 AM Performed by: Gwyndolyn Saxon, CRNA Pre-anesthesia Checklist: Patient identified, Emergency Drugs available, Suction available and Patient being monitored Patient Re-evaluated:Patient Re-evaluated prior to induction Oxygen Delivery Method: Circle system utilized Preoxygenation: Pre-oxygenation with 100% oxygen Induction Type: IV induction Ventilation: Mask ventilation without difficulty Laryngoscope Size: Miller and 2 Grade View: Grade I Tube type: Oral Tube size: 8.0 mm Number of attempts: 1 Airway Equipment and Method: Patient positioned with wedge pillow and Stylet Placement Confirmation: ETT inserted through vocal cords under direct vision, positive ETCO2 and breath sounds checked- equal and bilateral Secured at: 23 cm Tube secured with: Tape Dental Injury: Teeth and Oropharynx as per pre-operative assessment

## 2020-09-16 NOTE — H&P (Signed)
Rick Thomas is an 35 y.o. male.    Chief Complaint: Pre-OP LEFT robotic partial v. 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 Pt's father had kidney cancer. Mass 50% exophytic, no collecting system invovlment, and below hilar axis. 1 artery / 1 vein left renovascular anatomy. Some shoddy (<2cm) nodal tissue below level of hium and para-aortic location. NO contralateral or additional solid lesions. He is [place] American, denies known sickle cell or trait.   PMH sig for appy. 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 NO PCP at present.   Today "Rick Thomas" is seen to proceed with LEFT robotic partial v. Radical nephrectomy for left renal mass. NO interval fevers. He has required some pain meds pre-op which is unusual.   Past Medical History:  Diagnosis Date   Cancer ([place])    Chronic kidney disease     Past Surgical History:  Procedure Laterality Date   APPENDECTOMY     TONSILLECTOMY      Family History  Problem Relation Age of Onset   Hypertension Mother    Hypertension Father    Social History:  reports that he has been smoking cigarettes. He has never used smokeless tobacco. He reports that he does not currently use alcohol. He reports that he does not use drugs.  Allergies:  Allergies  Allergen Reactions   Amoxicillin     Unknown, states they've told him that since he was a kid   Penicillins     Unknown, states they've told him that since he was a kid    No medications prior to admission.    No results found for this or any previous visit (from the past 48 hour(s)). No results found.  Review of Systems  Constitutional: Negative.  Negative for chills and fever.  Genitourinary:  Positive for flank pain.  All other systems reviewed and are negative.  There were no vitals taken for this visit. Physical Exam Vitals reviewed.  HENT:     Head: Normocephalic.     Nose: Nose  normal.     Mouth/Throat:     Mouth: Mucous membranes are moist.  Eyes:     Pupils: Pupils are equal, round, and reactive to light.  Cardiovascular:     Rate and Rhythm: Normal rate.  Pulmonary:     Effort: Pulmonary effort is normal.  Abdominal:     General: Abdomen is flat.  Genitourinary:    Comments: Minimal CVAT at present.  Musculoskeletal:        General: Normal range of motion.     Cervical back: Normal range of motion.  Skin:    General: Skin is warm.  Neurological:     General: No focal deficit present.     Mental Status: He is alert.  Psychiatric:        Mood and Affect: Mood normal.     Assessment/Plan  Proceed as planned with LEFT partial v. Radical nephrectomhy and dedicated retroperitoneal node dissection. Risks, benefits, alternatives, expected peri-op course discussed previously and reiterated today.  Alexis Frock, MD 09/16/2020, 5:37 AM

## 2020-09-16 NOTE — Plan of Care (Signed)
  Problem: Clinical Measurements: Goal: Ability to maintain clinical measurements within normal limits will improve 09/16/2020 1629 by Albina Billet, RN Outcome: Progressing 09/16/2020 1628 by Albina Billet, RN Outcome: Progressing   Problem: Clinical Measurements: Goal: Will remain free from infection 09/16/2020 1629 by Albina Billet, RN Outcome: Progressing 09/16/2020 1628 by Albina Billet, RN Outcome: Progressing

## 2020-09-16 NOTE — Discharge Instructions (Signed)

## 2020-09-16 NOTE — Op Note (Signed)
NAMETARVIS, MANGELS MEDICAL RECORD NO: [id number] ACCOUNT NO: [id number] DATE OF BIRTH: [birth year] FACILITY: [facility] LOCATION: [id number] PHYSICIAN: Alexis Frock, MD  Operative Report   DATE OF PROCEDURE: 09/16/2020  PREOPERATIVE DIAGNOSIS:  Left renal mass.  PROCEDURE PERFORMED: 1.  Robotic-assisted laparoscopic left radical nephrectomy. 2.  Paraaortic lymph node dissection (retroperitoneal lymph node dissection). 3. Intraoperative Ultrasound with Interpretation  ESTIMATED BLOOD LOSS:  50 mL.  COMPLICATIONS:  None.  SPECIMENS:  1.  Left kidney. 2.  Paraortic lymph nodes.  FINDINGS:  1.  Significantly endophytic left renal mass with diffuse interface with kidney and parasitic vessels, not felt amenable to partial nephrectomy. 2.  Borderline paraaortic lymphadenopathy. 3.  Single artery, single vein, left renal vascular anatomy, several parasitic vessels laterally.  ASSISTANT:  Debbrah Alar, PA  INDICATIONS:  The patient is a pleasant 35 year old man who was found on workup of abdominal pain to have a fairly sizable enhancing solid renal mass in the lower pole.  No additional lesions noted.  He has no family history of sickle trait or heritable  cancer syndrome that he is aware of. Options were discussed for management including recommended path of partial versus radical nephrectomy with retroperitoneal lymph node dissection as he does have some shotty ipsilateral adenopathy for the therapeutic  and diagnostic intent.  He wished to proceed, informed consent was obtained, placed in medical record.  DESCRIPTION OF PROCEDURE:  The patient being verified, procedure being left partial versus radical nephrectomy was confirmed.  Procedure timeout was performed.  Intravenous antibiotics were administered.  General endotracheal anesthesia induced.  The  patient was placed into a left side up full flank position and pulling 15 degrees of table flexion, superior arm elevator, axillary roll,  sequential compression devices, bottom leg bent, top leg straight.  Foley catheter was placed per urethra to  straight drain.  He is further fashioned on the OR table using 3-inch tape over a foam padding across the supraxiphoid chest and his pelvis and a sterile field was created by first clipper shaving and then prepping and draping the patient's left flank  and abdomen using chlorhexidine gluconate.  Next, a high flow, low pressure pneumoperitoneum was obtained using Veress technique in the left lower quadrant having passed the aspiration drop test.  An 8 mm robotic camera port was then placed in position  approximately 1 handbreadth superior lateral to the umbilicus.  Laparoscopic examination of the peritoneal cavity revealed no significant adhesions and no visceral injury.  Additional ports were placed as follows:  Left subcostal 8 mm robotic port, left  far lateral 8 mm robotic port approximately 4 fingerbreadths superior medial to the anterior superior iliac spine, left paramedian inferior robotic port approximately 1 handbreadth superior to the pubic ramus and two 12 mm assistant port sites in the  midline, one approximately 2 fingerbreadths below the plane of the camera port and one approximately 2 fingerbreadths superior. Robot was docked and passed the electronic checks.  Next, attention was directed at the development of the retroperitoneum.   Incision was made lateral to the descending colon from the area of the splenic flexure towards the area of the internal ring and was carefully swept medially.  The anterior surface of the Gerota's fascia was encountered.  The mesentery of the descending  colon was carefully dissected away from this. Lower pole of the kidney area was identified and placed on gentle lateral traction.  Dissection proceeded medial to this. Ureter and gonadal vessels were encountered, placed on gentle lateral  traction.   Dissection proceeded within the structures in the psoas  musculature towards the area of renal hilum.  Renal hilum consisted of a single artery, single broad-based vein, renal vascular anatomy as anticipated.  There was a significant lumbar vein noted  inferiorly, also as anticipated lumbar vein did impede visualization of the left renal artery.  As such, it was controlled using Hem-o-lok clip proximal and distal.  This opened the window much more definitively towards the area of the left renal artery  which was circumferentially mobilized and marked with a vessel loop at this time. Attention was then directed at direct visualization and ultrasound of the renal mass.  Dissection proceeded directly into the renal parenchyma and the anterolateral aspect  of the kidney and the interface of the tumor was clearly found.  This was circumferentially mobilized and the interface appeared to be quite diffuse.  There were numerous parasitic vessels, especially laterally.  Some of them very large caliber, at  around 3-4 mm.  These were controlled using Hem-o-lok clips.  After the fat was cleared away from this, the interface was interrogated using intraoperative ultrasound.  Intraoperative ultrasound revealed a solid and cystic component mass that was somewhat indistinct from the normal parenchyma.  There appeared to be encroachment of the mass directly into a large aspect of the renal pelvis as well as perisinus fat, this  is highly concerning for stage III disease.  Given this finding and the numerous parasitic vessels, it was clearly felt the most oncologic sound procedure would be to proceed with radical nephrectomy.  As such, the vessel loop on the artery was removed  and the artery was controlled using Hem-o-Lok clip extra large proximal, vascular stapler distal and the vein was controlled using a vascular stapler.  Superior attachments were taken down with cautery scissors as were the lateral attachments.  Inferiorly, the ureter was doubly clipped and ligated as was  the  gonadal vessel.  This completely freed the left radical nephrectomy specimen, was placed in the extra large EndoCatch bag for later retrieval.  Attention was directed at retroperitoneal  lymph node dissection.  The area of the aorta was inspected directly inferior to the renal hilum and there was some shotty adenopathy within this area.  As such, formal retroperitoneal lymph node dissection was performed by mobilizing fibrofatty tissue  on the lateral aspect of the aorta from the left renal artery inferiorly for a distance of approximately 5 cm.  Very careful hemostasis was achieved with cold clips for hemostasis left paraaortic lymph nodes.  We achieved the goal of surgery today.  Hemostasis was  excellent.  Sponge and needle counts were correct.  The robot was undocked.  Specimen was retrieved by extending the previous assistant port site in the midline and removing the large left radical nephrectomy specimen and setting aside for permanent  pathology.  This was closed at level of fascia using figure-of-eight PDS x5 followed by reapproximation of Scarpa's with running Vicryl.  All incision sites were infiltrated with dilute lipolyzed Marcaine and closed at the level of skin using  subcuticular Monocryl and Dermabond.  Procedure was then terminated.  The patient tolerated the procedure well.  No immediate perioperative complications.  The patient was taken to postanesthesia care in stable condition.  Plan for inpatient admission.  Please note, first assistant, Debbrah Alar, was crucial for all portions of the surgery today.  She provided invaluable retraction, suctioning, vascular clipping, lymphatic clipping and general first assistance.   SHW  D: 09/16/2020 2:31:01 pm T: 09/16/2020 10:36:00 pm  JOB: [id number]

## 2020-09-16 NOTE — Brief Op Note (Signed)
09/16/2020  2:23 PM  PATIENT:  Rick Thomas  35 y.o. male  PRE-OPERATIVE DIAGNOSIS:  LEFT RENAL MASS  POST-OPERATIVE DIAGNOSIS:  LEFT RENAL MASS  PROCEDURE:  Procedure(s) with comments: XI ROBOTIC ASSISTED LAPAROSCOPIC RADICAL NEPHRECTOMY (Left) - 3 HRS RETROPERITONEAL LYMPH NODE DISSECTION (Left)  SURGEON:  Surgeon(s) and Role:    Alexis Frock, MD - Primary  PHYSICIAN ASSISTANT:   ASSISTANTS: Clemetine Marker PA   ANESTHESIA:   local and general  EBL:  50 mL   BLOOD ADMINISTERED:none  DRAINS:  foley to gravity    LOCAL MEDICATIONS USED:  MARCAINE     SPECIMEN:  Source of Specimen:  1 - left kindey, 2 - para-aortic lymph nodes  DISPOSITION OF SPECIMEN:  PATHOLOGY  COUNTS:  YES  TOURNIQUET:  * No tourniquets in log *  DICTATION: .Other Dictation: Dictation Number [id number]  PLAN OF CARE: Admit to inpatient   PATIENT DISPOSITION:  PACU - hemodynamically stable.   Delay start of Pharmacological VTE agent (>24hrs) due to surgical blood loss or risk of bleeding: yes

## 2020-09-17 ENCOUNTER — Encounter (HOSPITAL_COMMUNITY): Payer: Self-pay | Admitting: Urology

## 2020-09-17 LAB — BASIC METABOLIC PANEL
Anion gap: 9 (ref 5–15)
BUN: 13 mg/dL (ref 6–20)
CO2: 25 mmol/L (ref 22–32)
Calcium: 9.1 mg/dL (ref 8.9–10.3)
Chloride: 101 mmol/L (ref 98–111)
Creatinine, Ser: 1.76 mg/dL — ABNORMAL HIGH (ref 0.61–1.24)
GFR, Estimated: 50 mL/min — ABNORMAL LOW (ref >60)
Glucose, Bld: 121 mg/dL — ABNORMAL HIGH (ref 70–99)
Potassium: 4.1 mmol/L (ref 3.5–5.1)
Sodium: 135 mmol/L (ref 135–145)

## 2020-09-17 LAB — HEMOGLOBIN AND HEMATOCRIT, BLOOD
HCT: 41.9 % (ref 39.0–52.0)
Hemoglobin: 14.5 g/dL (ref 13.0–17.0)

## 2020-09-17 MED ORDER — CHLORHEXIDINE GLUCONATE CLOTH 2 % EX PADS: 6.0000 | MEDICATED_PAD | Freq: Every day | CUTANEOUS | Status: DC

## 2020-09-17 NOTE — Discharge Summary (Addendum)
[facility] Discharge Summary  Admit date: 09/16/2020  Discharge date and time: 09/18/20   Discharge to: Home  Discharge Service: Urology  Discharge Attending Physician:  Dr. Rexene Alberts  Discharge  Diagnoses: Left renal mass  Secondary Diagnosis: Active Problems:   Renal mass   OR Procedures: Procedure(s): XI ROBOTIC ASSISTED LAPAROSCOPIC RADICAL NEPHRECTOMY RETROPERITONEAL LYMPH NODE DISSECTION 09/16/2020   Ancillary Procedures: None   Discharge Day Services: The patient was seen and examined by the Urology team both in the morning and immediately prior to discharge.  Vital signs and laboratory values were stable and within normal limits.  The physical exam was benign and unchanged and all surgical wounds were examined.  Discharge instructions were explained and all questions answered.  Subjective  No acute events overnight. Pain Controlled. No fever or chills. Passing gas.  Objective Patient Vitals for the past 8 hrs:  BP Temp Temp src Pulse Resp SpO2  09/18/20 1206 (!) 135/98 98.5 F (36.9 C) -- (!) 59 20 92 %  09/18/20 0651 (!) 158/99 98.8 F (37.1 C) Oral (!) 57 18 97 %   Total I/O In: 120 [P.O.:120] Out: 1025 [Urine:1025]  General Appearance:        No acute distress Lungs:                       Normal work of breathing on room air Heart:                                Regular rate and rhythm Abdomen:                         Soft, appropriately-tender, non-distended. Incisions c/d/i Extremities:                      Warm and well perfused   Hospital Course:  The patient underwent left radical nephrectomy and peri-hilar lymph node dissection on 09/16/2020.  The patient tolerated the procedure well, was extubated in the OR, and afterwards was taken to the PACU for routine post-surgical care. When stable the patient was transferred to the floor.   The patient did well postoperatively.  The patient's diet was slowly advanced and at the time of discharge was  tolerating a regular diet.  The patient was discharged home 2 Days Post-Op, at which point was tolerating a regular solid diet, was able to void spontaneously, have adequate pain control with P.O. pain medication, and could ambulate without difficulty. The patient will follow up with [place] for post op check.   Condition at Discharge: Improved  Discharge Medications:  Allergies as of 09/18/2020       Reactions   Amoxicillin    Unknown, states they've told him that since he was a kid   Penicillins    Unknown, states they've told him that since he was a kid        Medication List     TAKE these medications    docusate sodium 100 MG capsule Commonly known as: COLACE Take 1 capsule (100 mg total) by mouth 2 (two) times daily.   oxyCODONE-acetaminophen 5-325 MG tablet Commonly known as: PERCOCET/ROXICET Take 1-2 tablets by mouth every 6 (six) hours as needed for severe pain or moderate pain. What changed:  how much to take when to take this reasons to take this  Matt R. [facility]  Pager: [phone]

## 2020-09-17 NOTE — Plan of Care (Signed)
  Problem: Activity: Goal: Risk for activity intolerance will decrease Outcome: Progressing   Problem: Coping: Goal: Level of anxiety will decrease Outcome: Progressing   Problem: Pain Managment: Goal: General experience of comfort will improve Outcome: Progressing   

## 2020-09-17 NOTE — Progress Notes (Addendum)
Urology Progress Note   1 Day Post-Op from left radical nephrectomy and limited peri-hilar LND.   Subjective: NAEON.  Some pain and nausea early in the night, but improved this AM Some oozing from his midline incision No gas or stool yet  Objective: Vital signs in last 24 hours: Temp:  [97.6 F (36.4 C)-98.8 F (37.1 C)] 98.1 F (36.7 C) (08/27 0040) Pulse Rate:  [50-77] 62 (08/27 0517) Resp:  [16-22] 18 (08/27 0517) BP: (143-191)/(88-108) 165/88 (08/27 0517) SpO2:  [96 %-100 %] 96 % (08/27 0517) Arterial Line BP: (157-182)/(87-94) 181/91 (08/26 1515) Weight:  [111.5 kg-111.6 kg] 111.5 kg (08/27 0711)  Intake/Output from previous day: 08/26 0701 - 08/27 0700 In: 3548.2 [P.O.:450; I.V.:2848.2; IV Piggyback:250] Out: 350 [Urine:300; Blood:50] Intake/Output this shift: Total I/O In: -  Out: 3000 [Urine:3000]  Physical Exam:  General: Alert and oriented CV: Regular rate Lungs: No increased work of breathing Abdomen: Soft, appropriately tender. Incisions c/d/i. Mild oozing from midline wound without separation. GU: Foley in place draining clear yellow urine  Ext: NT, No erythema  Lab Results: Recent Labs    09/17/20 0558  HGB 14.5  HCT 41.9   Recent Labs    09/17/20 0558  NA 135  K 4.1  CL 101  CO2 25  GLUCOSE 121*  BUN 13  CREATININE 1.76*  CALCIUM 9.1    Studies/Results: No results found.  Assessment/Plan:  35 y.o. male s/p left radical nephrectomy and limited peri-hilar LND.  Overall doing well post-op.   - D/C Foley this AM with TOV - OOB/Ambulate/IS - Encourage PO intake and monitor tolerance   Dispo: If doing ok and meeting goals can discharge this afternoon, otherwise will continue to watch until tomorrow   LOS: 1 day   I have seen and examined the patient and agree with the above assessment and plan.  C/o pain, states not well controlled. Some nausea overnight, improved. No emesis. Afebrile. Undergoing void trial.  -Pain control  prn -Diet as tolerated -Passed void trial. -He prefers to stay another night. -Will plan for discharge home in the AM  [facility]  Pager: [phone]

## 2020-09-17 NOTE — Plan of Care (Signed)

## 2020-09-19 LAB — SURGICAL PATHOLOGY

## 2020-09-27 ENCOUNTER — Other Ambulatory Visit: Payer: Self-pay

## 2020-09-27 ENCOUNTER — Encounter (HOSPITAL_BASED_OUTPATIENT_CLINIC_OR_DEPARTMENT_OTHER): Payer: Self-pay | Admitting: *Deleted

## 2020-09-27 ENCOUNTER — Emergency Department (HOSPITAL_BASED_OUTPATIENT_CLINIC_OR_DEPARTMENT_OTHER): Payer: Commercial Managed Care - PPO

## 2020-09-27 ENCOUNTER — Emergency Department (HOSPITAL_BASED_OUTPATIENT_CLINIC_OR_DEPARTMENT_OTHER)
Admission: EM | Admit: 2020-09-27 | Discharge: 2020-09-27 | Disposition: A | Discharge status: Home / Self Care | Payer: Commercial Managed Care - PPO | Attending: Emergency Medicine | Admitting: Emergency Medicine

## 2020-09-27 DIAGNOSIS — Z20822 Contact with and (suspected) exposure to covid-19: Secondary | ICD-10-CM | POA: Diagnosis not present

## 2020-09-27 DIAGNOSIS — R6883 Chills (without fever): Secondary | ICD-10-CM | POA: Insufficient documentation

## 2020-09-27 DIAGNOSIS — N189 Chronic kidney disease, unspecified: Secondary | ICD-10-CM | POA: Insufficient documentation

## 2020-09-27 DIAGNOSIS — R1031 Right lower quadrant pain: Secondary | ICD-10-CM | POA: Insufficient documentation

## 2020-09-27 DIAGNOSIS — R140 Abdominal distension (gaseous): Secondary | ICD-10-CM | POA: Diagnosis not present

## 2020-09-27 DIAGNOSIS — R112 Nausea with vomiting, unspecified: Secondary | ICD-10-CM | POA: Diagnosis not present

## 2020-09-27 DIAGNOSIS — D72829 Elevated white blood cell count, unspecified: Secondary | ICD-10-CM | POA: Insufficient documentation

## 2020-09-27 DIAGNOSIS — R109 Unspecified abdominal pain: Secondary | ICD-10-CM

## 2020-09-27 DIAGNOSIS — F17210 Nicotine dependence, cigarettes, uncomplicated: Secondary | ICD-10-CM | POA: Diagnosis not present

## 2020-09-27 DIAGNOSIS — Z859 Personal history of malignant neoplasm, unspecified: Secondary | ICD-10-CM | POA: Diagnosis not present

## 2020-09-27 LAB — COMPREHENSIVE METABOLIC PANEL
ALT: 38 U/L (ref 0–44)
AST: 28 U/L (ref 15–41)
Albumin: 4.6 g/dL (ref 3.5–5.0)
Alkaline Phosphatase: 64 U/L (ref 38–126)
Anion gap: 11 (ref 5–15)
BUN: 16 mg/dL (ref 6–20)
CO2: 24 mmol/L (ref 22–32)
Calcium: 9.9 mg/dL (ref 8.9–10.3)
Chloride: 101 mmol/L (ref 98–111)
Creatinine, Ser: 1.83 mg/dL — ABNORMAL HIGH (ref 0.61–1.24)
GFR, Estimated: 48 mL/min — ABNORMAL LOW (ref >60)
Glucose, Bld: 99 mg/dL (ref 70–99)
Potassium: 4.3 mmol/L (ref 3.5–5.1)
Sodium: 136 mmol/L (ref 135–145)
Total Bilirubin: 0.5 mg/dL (ref 0.3–1.2)
Total Protein: 8.4 g/dL — ABNORMAL HIGH (ref 6.5–8.1)

## 2020-09-27 LAB — CBC WITH DIFFERENTIAL/PLATELET
Abs Immature Granulocytes: 0.04 10*3/uL (ref 0.00–0.07)
Basophils Absolute: 0.1 10*3/uL (ref 0.0–0.1)
Basophils Relative: 1 %
Eosinophils Absolute: 0.6 10*3/uL — ABNORMAL HIGH (ref 0.0–0.5)
Eosinophils Relative: 4 %
HCT: 47.8 % (ref 39.0–52.0)
Hemoglobin: 16.9 g/dL (ref 13.0–17.0)
Immature Granulocytes: 0 %
Lymphocytes Relative: 15 %
Lymphs Abs: 2 10*3/uL (ref 0.7–4.0)
MCH: 31.7 pg (ref 26.0–34.0)
MCHC: 35.4 g/dL (ref 30.0–36.0)
MCV: 89.7 fL (ref 80.0–100.0)
Monocytes Absolute: 0.8 10*3/uL (ref 0.1–1.0)
Monocytes Relative: 6 %
Neutro Abs: 10.2 10*3/uL — ABNORMAL HIGH (ref 1.7–7.7)
Neutrophils Relative %: 74 %
Platelets: 343 10*3/uL (ref 150–400)
RBC: 5.33 MIL/uL (ref 4.22–5.81)
RDW: 11.8 % (ref 11.5–15.5)
WBC: 13.7 10*3/uL — ABNORMAL HIGH (ref 4.0–10.5)
nRBC: 0 % (ref 0.0–0.2)

## 2020-09-27 LAB — LACTIC ACID, PLASMA: Lactic Acid, Venous: 1.1 mmol/L (ref 0.5–1.9)

## 2020-09-27 LAB — URINALYSIS, MICROSCOPIC (REFLEX)

## 2020-09-27 LAB — URINALYSIS, ROUTINE W REFLEX MICROSCOPIC
Bilirubin Urine: NEGATIVE
Glucose, UA: NEGATIVE mg/dL
Hgb urine dipstick: NEGATIVE
Ketones, ur: NEGATIVE mg/dL
Leukocytes,Ua: NEGATIVE
Nitrite: NEGATIVE
Protein, ur: 30 mg/dL — AB
Specific Gravity, Urine: 1.01 (ref 1.005–1.030)
pH: 8 (ref 5.0–8.0)

## 2020-09-27 LAB — RESP PANEL BY RT-PCR (FLU A&B, COVID) ARPGX2
Influenza A by PCR: NEGATIVE
Influenza B by PCR: NEGATIVE
SARS Coronavirus 2 by RT PCR: NEGATIVE

## 2020-09-27 LAB — LIPASE, BLOOD: Lipase: 30 U/L (ref 11–51)

## 2020-09-27 MED ORDER — ONDANSETRON 4 MG PO TBDP: 4.0000 mg | ORAL_TABLET | Freq: Three times a day (TID) | ORAL | 0 refills | Status: DC | PRN

## 2020-09-27 MED ORDER — ONDANSETRON HCL 4 MG/2ML IJ SOLN
4.0000 mg | Freq: Once | INTRAMUSCULAR | Status: AC
  Administered 2020-09-27: 4 mg via INTRAVENOUS
  Filled 2020-09-27: qty 2

## 2020-09-27 MED ORDER — SENNOSIDES-DOCUSATE SODIUM 8.6-50 MG PO TABS: 2.0000 | ORAL_TABLET | Freq: Every evening | ORAL | 0 refills | Status: AC | PRN

## 2020-09-27 MED ORDER — HALOPERIDOL LACTATE 5 MG/ML IJ SOLN
4.0000 mg | Freq: Once | INTRAMUSCULAR | Status: AC
  Administered 2020-09-27: 4 mg via INTRAVENOUS
  Filled 2020-09-27: qty 1

## 2020-09-27 MED ORDER — SODIUM CHLORIDE 0.9 % IV BOLUS
500.0000 mL | Freq: Once | INTRAVENOUS | Status: AC
  Administered 2020-09-27: 500 mL via INTRAVENOUS

## 2020-09-27 MED ORDER — LACTATED RINGERS IV BOLUS
1000.0000 mL | Freq: Once | INTRAVENOUS | Status: AC
  Administered 2020-09-27: 1000 mL via INTRAVENOUS

## 2020-09-27 MED ORDER — FENTANYL CITRATE PF 50 MCG/ML IJ SOSY
50.0000 ug | PREFILLED_SYRINGE | Freq: Once | INTRAMUSCULAR | Status: AC
  Administered 2020-09-27: 50 ug via INTRAVENOUS
  Filled 2020-09-27: qty 1

## 2020-09-27 MED ORDER — IOHEXOL 350 MG/ML SOLN
100.0000 mL | Freq: Once | INTRAVENOUS | Status: AC | PRN
  Administered 2020-09-27: 85 mL via INTRAVENOUS

## 2020-09-27 NOTE — ED Triage Notes (Signed)
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 Here today with nausea, abdominal pain, light headedness.

## 2020-09-27 NOTE — ED Notes (Signed)
Pt tolerating PO intake at this time, reports that he is ready to go home.  EDP Schlossman made aware.

## 2020-09-27 NOTE — Discharge Instructions (Addendum)
You may take a miralax taper (4 caps in 1 32oz bottle day 1, 3 caps day 2 etc as an aggressive constipation regimen.) At this time, I also feel it is reasonable to start with miralax 1 cap, and colace.

## 2020-09-27 NOTE — ED Notes (Signed)
ED Provider at bedside. 

## 2020-09-27 NOTE — ED Notes (Signed)
Discharge instructions discussed with pt. Pt verbalized understanding. Pt stable and ambulatory.  °

## 2020-09-27 NOTE — ED Provider Notes (Signed)
[facility] EMERGENCY DEPARTMENT Provider Note   CSN: [id number] Arrival date & time: 09/27/20  1226     History Chief Complaint  Patient presents with   Abdominal Pain    Rick Thomas is a 35 y.o. male.  The history is provided by the patient.  Abdominal Pain Pain location:  RUQ, RLQ, epigastric and suprapubic Pain quality: aching and bloating   Pain radiates to:  Does not radiate Pain severity:  Mild Onset quality:  Gradual Duration:  8 hours Timing:  Constant Progression:  Unchanged Chronicity:  New Context: previous surgery (Recent left nephrectomy for cancer, had chills and low grade fever this morning with n/v. Took tylenol.)   Relieved by:  Nothing Worsened by:  Nothing Associated symptoms: chills, nausea and vomiting   Associated symptoms: no anorexia, no chest pain, no cough, no dysuria, no fever, no hematuria, no shortness of breath and no sore throat   Risk factors: multiple surgeries       Past Medical History:  Diagnosis Date   Cancer ([place])    Chronic kidney disease     Patient Active Problem List   Diagnosis Date Noted   Renal mass 09/16/2020    Past Surgical History:  Procedure Laterality Date   APPENDECTOMY     LYMPH NODE DISSECTION Left 09/16/2020   Procedure: RETROPERITONEAL LYMPH NODE DISSECTION;  Surgeon: Alexis Frock, MD;  Location: [facility];  Service: Urology;  Laterality: Left;   ROBOT ASSISTED LAPAROSCOPIC NEPHRECTOMY Left 09/16/2020   Procedure: XI ROBOTIC ASSISTED LAPAROSCOPIC RADICAL NEPHRECTOMY;  Surgeon: Alexis Frock, MD;  Location: [facility];  Service: Urology;  Laterality: Left;  3 HRS   TONSILLECTOMY         Family History  Problem Relation Age of Onset   Hypertension Mother    Hypertension Father     Social History   Tobacco Use   Smoking status: Some Days    Types: Cigarettes   Smokeless tobacco: Never  Vaping Use   Vaping Use: Never used  Substance Use Topics   Alcohol use: Not Currently    Comment:  8 liquor drinks per week   Drug use: No    Home Medications Prior to Admission medications   Medication Sig Start Date End Date Taking? Authorizing Provider  oxyCODONE-acetaminophen (PERCOCET/ROXICET) 5-325 MG tablet Take 1-2 tablets by mouth every 6 (six) hours as needed for severe pain or moderate pain. 09/16/20  Yes Dancy, Estill Bamberg, PA-C  docusate sodium (COLACE) 100 MG capsule Take 1 capsule (100 mg total) by mouth 2 (two) times daily. 09/16/20   Debbrah Alar, PA-C    Allergies    Amoxicillin and Penicillins  Review of Systems   Review of Systems  Constitutional:  Positive for chills. Negative for fever.  HENT:  Negative for ear pain and sore throat.   Eyes:  Negative for pain and visual disturbance.  Respiratory:  Negative for cough and shortness of breath.   Cardiovascular:  Negative for chest pain and palpitations.  Gastrointestinal:  Positive for abdominal pain, nausea and vomiting. Negative for anorexia.  Genitourinary:  Negative for dysuria and hematuria.  Musculoskeletal:  Negative for arthralgias and back pain.  Skin:  Negative for color change and rash.  Neurological:  Negative for seizures and syncope.  All other systems reviewed and are negative.  Physical Exam Updated Vital Signs   ED Triage Vitals  Enc Vitals Group     BP 09/27/20 1244 (!) 149/110     Pulse Rate 09/27/20 1244  70     Resp 09/27/20 1244 20     Temp 09/27/20 1244 98.1 F (36.7 C)     Temp Source 09/27/20 1244 Oral     SpO2 09/27/20 1244 99 %     Weight 09/27/20 1242 245 lb 13 oz (111.5 kg)     Height 09/27/20 1242 6' (1.829 m)     Head Circumference --      Peak Flow --      Pain Score 09/27/20 1242 10     Pain Loc --      Pain Edu? --      Excl. in [place]? --      Physical Exam Vitals and nursing note reviewed.  Constitutional:      General: He is not in acute distress.    Appearance: He is well-developed. He is not ill-appearing.  HENT:     Head: Normocephalic and atraumatic.      Mouth/Throat:     Mouth: Mucous membranes are moist.  Eyes:     Extraocular Movements: Extraocular movements intact.     Conjunctiva/sclera: Conjunctivae normal.     Pupils: Pupils are equal, round, and reactive to light.  Cardiovascular:     Rate and Rhythm: Normal rate and regular rhythm.     Heart sounds: No murmur heard. Pulmonary:     Effort: Pulmonary effort is normal. No respiratory distress.     Breath sounds: Normal breath sounds.  Abdominal:     General: There is distension.     Palpations: Abdomen is soft.     Tenderness: There is abdominal tenderness in the left upper quadrant and left lower quadrant.  Musculoskeletal:     Cervical back: Neck supple.  Skin:    General: Skin is warm and dry.     Capillary Refill: Capillary refill takes less than 2 seconds.  Neurological:     General: No focal deficit present.     Mental Status: He is alert.    ED Results / Procedures / Treatments   Labs (all labs ordered are listed, but only abnormal results are displayed) Labs Reviewed  URINALYSIS, ROUTINE W REFLEX MICROSCOPIC - Abnormal; Notable for the following components:      Result Value   Protein, ur 30 (*)    All other components within normal limits  CBC WITH DIFFERENTIAL/PLATELET - Abnormal; Notable for the following components:   WBC 13.7 (*)    Neutro Abs 10.2 (*)    Eosinophils Absolute 0.6 (*)    All other components within normal limits  COMPREHENSIVE METABOLIC PANEL - Abnormal; Notable for the following components:   Creatinine, Ser 1.83 (*)    Total Protein 8.4 (*)    GFR, Estimated 48 (*)    All other components within normal limits  URINALYSIS, MICROSCOPIC (REFLEX) - Abnormal; Notable for the following components:   Bacteria, UA FEW (*)    All other components within normal limits  CULTURE, BLOOD (ROUTINE X 2)  CULTURE, BLOOD (ROUTINE X 2)  RESP PANEL BY RT-PCR (FLU A&B, COVID) ARPGX2  URINE CULTURE  LIPASE, BLOOD  LACTIC ACID, PLASMA  LACTIC ACID,  PLASMA    EKG None  Radiology No results found.  Procedures Procedures   Medications Ordered in ED Medications  ondansetron (ZOFRAN) injection 4 mg (4 mg Intravenous Given 09/27/20 1332)  fentaNYL (SUBLIMAZE) injection 50 mcg (50 mcg Intravenous Given 09/27/20 1337)  sodium chloride 0.9 % bolus 500 mL (500 mLs Intravenous New Bag/Given 09/27/20 1329)  iohexol (  OMNIPAQUE) 350 MG/ML injection 100 mL (85 mLs Intravenous Contrast Given 09/27/20 1445)    ED Course  I have reviewed the triage vital signs and the nursing notes.  Pertinent labs & imaging results that were available during my care of the patient were reviewed by me and considered in my medical decision making (see chart for details).    MDM Rules/Calculators/A&P                           Cardell Tae is here with abdominal pain, nausea, vomiting, chills.  Recent left nephrectomy for cancer.  Not undergoing any chemotherapy or other further treatments as this was localized.  States that he had chills and fever to 100 at home this morning.  Took Tylenol before arrival.  Has had abdominal pain on and off since his surgery slightly worse this morning.  Had several episodes of emesis.  Had a bowel movement this morning but feels bloated.  Has not passed gas since.  He is tender throughout his abdomen but mostly in the left side.  Given recent surgery and possibly fever at home we will pursue infectious work-up.  We will also get CT scan abdomen and pelvis.  Patient with mild leukocytosis but normal lactic acid.  No significant anemia, electrolyte abnormality, kidney injury otherwise.  Urinalysis with no signs of infection.  CT scan is pending.  Patient handed off to oncoming ED staff with patient pending CT.  Please see their note for further results, evaluation, disposition of the patient.  This chart was dictated using voice recognition software.  Despite best efforts to proofread,  errors can occur which can change the documentation  meaning.   Final Clinical Impression(s) / ED Diagnoses Final diagnoses:  Abdominal pain, unspecified abdominal location    Rx / DC Orders ED Discharge Orders     None        Lennice Sites, DO 09/27/20 1451

## 2020-09-27 NOTE — ED Notes (Signed)
Pt given ginger ale and graham crackers for PO challenge, he reports improvement in abdominal pain.

## 2020-09-27 NOTE — ED Notes (Signed)
XR at bedside

## 2020-09-28 LAB — URINE CULTURE: Culture: NO GROWTH

## 2020-10-02 LAB — CULTURE, BLOOD (ROUTINE X 2)
Culture: NO GROWTH
Culture: NO GROWTH
Special Requests: ADEQUATE

## 2020-10-28 ENCOUNTER — Telehealth: Payer: Self-pay

## 2020-10-28 NOTE — Telephone Encounter (Signed)
Received Short Term Disability Forms for Patient from [place]. Contacted [place] [employer] and provided the correct information regarding the treating Physician for this claim. Attempted to notify patient but was unable to reach him. Forms forwarded back to [place] and to Patient

## 2020-11-28 ENCOUNTER — Other Ambulatory Visit: Payer: Self-pay

## 2020-11-28 ENCOUNTER — Encounter (HOSPITAL_BASED_OUTPATIENT_CLINIC_OR_DEPARTMENT_OTHER): Payer: Self-pay

## 2020-11-28 DIAGNOSIS — R0981 Nasal congestion: Secondary | ICD-10-CM | POA: Insufficient documentation

## 2020-11-28 DIAGNOSIS — R1110 Vomiting, unspecified: Secondary | ICD-10-CM | POA: Insufficient documentation

## 2020-11-28 DIAGNOSIS — Z859 Personal history of malignant neoplasm, unspecified: Secondary | ICD-10-CM | POA: Diagnosis not present

## 2020-11-28 DIAGNOSIS — N189 Chronic kidney disease, unspecified: Secondary | ICD-10-CM | POA: Insufficient documentation

## 2020-11-28 DIAGNOSIS — R059 Cough, unspecified: Secondary | ICD-10-CM | POA: Diagnosis not present

## 2020-11-28 DIAGNOSIS — M545 Low back pain, unspecified: Secondary | ICD-10-CM | POA: Insufficient documentation

## 2020-11-28 DIAGNOSIS — R1032 Left lower quadrant pain: Secondary | ICD-10-CM | POA: Diagnosis present

## 2020-11-28 DIAGNOSIS — F17210 Nicotine dependence, cigarettes, uncomplicated: Secondary | ICD-10-CM | POA: Diagnosis not present

## 2020-11-28 LAB — URINALYSIS, ROUTINE W REFLEX MICROSCOPIC
Bilirubin Urine: NEGATIVE
Glucose, UA: NEGATIVE mg/dL
Hgb urine dipstick: NEGATIVE
Ketones, ur: NEGATIVE mg/dL
Leukocytes,Ua: NEGATIVE
Nitrite: NEGATIVE
Protein, ur: NEGATIVE mg/dL
Specific Gravity, Urine: 1.023 (ref 1.005–1.030)
pH: 6 (ref 5.0–8.0)

## 2020-11-28 NOTE — ED Triage Notes (Signed)
Patient here POV from Home with Lower Back Pain.  Pain began approximately 1 week PTA.  Patient states he was recently admitted and had Left Kidney removed in August due to Cancer. No Urinary Symptoms.  NAD Noted during Triage. A&Ox4. GCS 15. Ambulatory.

## 2020-11-29 ENCOUNTER — Emergency Department (HOSPITAL_BASED_OUTPATIENT_CLINIC_OR_DEPARTMENT_OTHER)
Admission: EM | Admit: 2020-11-29 | Discharge: 2020-11-29 | Disposition: A | Discharge status: Home / Self Care | Payer: Commercial Managed Care - PPO | Attending: Emergency Medicine | Admitting: Emergency Medicine

## 2020-11-29 ENCOUNTER — Emergency Department (HOSPITAL_BASED_OUTPATIENT_CLINIC_OR_DEPARTMENT_OTHER): Payer: Commercial Managed Care - PPO

## 2020-11-29 DIAGNOSIS — R1032 Left lower quadrant pain: Secondary | ICD-10-CM

## 2020-11-29 DIAGNOSIS — M545 Low back pain, unspecified: Secondary | ICD-10-CM

## 2020-11-29 LAB — CBC WITH DIFFERENTIAL/PLATELET
Abs Immature Granulocytes: 0.03 10*3/uL (ref 0.00–0.07)
Basophils Absolute: 0.1 10*3/uL (ref 0.0–0.1)
Basophils Relative: 1 %
Eosinophils Absolute: 0.2 10*3/uL (ref 0.0–0.5)
Eosinophils Relative: 3 %
HCT: 40.4 % (ref 39.0–52.0)
Hemoglobin: 13.9 g/dL (ref 13.0–17.0)
Immature Granulocytes: 0 %
Lymphocytes Relative: 33 %
Lymphs Abs: 3.2 10*3/uL (ref 0.7–4.0)
MCH: 31.2 pg (ref 26.0–34.0)
MCHC: 34.4 g/dL (ref 30.0–36.0)
MCV: 90.6 fL (ref 80.0–100.0)
Monocytes Absolute: 0.8 10*3/uL (ref 0.1–1.0)
Monocytes Relative: 8 %
Neutro Abs: 5.4 10*3/uL (ref 1.7–7.7)
Neutrophils Relative %: 55 %
Platelets: 231 10*3/uL (ref 150–400)
RBC: 4.46 MIL/uL (ref 4.22–5.81)
RDW: 12.7 % (ref 11.5–15.5)
WBC: 9.7 10*3/uL (ref 4.0–10.5)
nRBC: 0 % (ref 0.0–0.2)

## 2020-11-29 LAB — BASIC METABOLIC PANEL
Anion gap: 8 (ref 5–15)
BUN: 16 mg/dL (ref 6–20)
CO2: 25 mmol/L (ref 22–32)
Calcium: 9.3 mg/dL (ref 8.9–10.3)
Chloride: 103 mmol/L (ref 98–111)
Creatinine, Ser: 1.4 mg/dL — ABNORMAL HIGH (ref 0.61–1.24)
GFR, Estimated: >60 mL/min (ref >60)
Glucose, Bld: 84 mg/dL (ref 70–99)
Potassium: 3.9 mmol/L (ref 3.5–5.1)
Sodium: 136 mmol/L (ref 135–145)

## 2020-11-29 MED ORDER — FENTANYL CITRATE PF 50 MCG/ML IJ SOSY
100.0000 ug | PREFILLED_SYRINGE | Freq: Once | INTRAMUSCULAR | Status: AC
Start: 2020-11-29 — End: 2020-11-29
  Administered 2020-11-29: 100 ug via INTRAVENOUS
  Filled 2020-11-29: qty 2

## 2020-11-29 MED ORDER — CYCLOBENZAPRINE HCL 10 MG PO TABS: 10.0000 mg | ORAL_TABLET | Freq: Two times a day (BID) | ORAL | 0 refills | Status: DC | PRN

## 2020-11-29 NOTE — Discharge Instructions (Signed)

## 2020-11-29 NOTE — ED Provider Notes (Signed)
[facility] EMERGENCY DEPT Provider Note   CSN: [id number] Arrival date & time: 11/28/20  1759     History Chief Complaint  Patient presents with   Back Pain    Rick Thomas is a 35 y.o. male.  The history is provided by the patient.  Flank Pain This is a new problem. The current episode started more than 2 days ago. The problem has been gradually worsening. Associated symptoms include abdominal pain. Pertinent negatives include no chest pain. The symptoms are aggravated by walking. Nothing relieves the symptoms. He has tried rest for the symptoms. The treatment provided no relief.  Patient with history of left renal mass s/p left nephrectomy presents with low back and left flank pain.  He reports he recently began exercising after he was given clearance by his surgeon.  He also reports recent cough and congestion.  He reports over the past several days has had increasing low back pain.  He also reports left lower abdominal pain.  He does report an episode of vomiting.  No fevers.  No dysuria. No new weakness.    Past Medical History:  Diagnosis Date   Cancer [facility])    Chronic kidney disease     Patient Active Problem List   Diagnosis Date Noted   Renal mass 09/16/2020    Past Surgical History:  Procedure Laterality Date   APPENDECTOMY     LYMPH NODE DISSECTION Left 09/16/2020   Procedure: RETROPERITONEAL LYMPH NODE DISSECTION;  Surgeon: Alexis Frock, MD;  Location[facility];  Service: Urology;  Laterality: Left;   ROBOT ASSISTED LAPAROSCOPIC NEPHRECTOMY Left 09/16/2020   Procedure: XI ROBOTIC ASSISTED LAPAROSCOPIC RADICAL NEPHRECTOMY;  Surgeon: Alexis Frock, MD;  [facility];  Service: Urology;  Laterality: Left;  3 HRS   TONSILLECTOMY         Family History  Problem Relation Age of Onset   Hypertension Mother    Hypertension Father     Social History   Tobacco Use   Smoking status: Some Days    Types: Cigarettes   Smokeless tobacco: Never   Vaping Use   Vaping Use: Never used  Substance Use Topics   Alcohol use: Not Currently    Comment: 8 liquor drinks per week   Drug use: No    Home Medications Prior to Admission medications   Medication Sig Start Date End Date Taking? Authorizing Provider  cyclobenzaprine (FLEXERIL) 10 MG tablet Take 1 tablet (10 mg total) by mouth 2 (two) times daily as needed for muscle spasms. 11/29/20  Yes Ripley Fraise, MD    Allergies    Amoxicillin and Penicillins  Review of Systems   Review of Systems  Constitutional:  Negative for fever.  Cardiovascular:  Negative for chest pain.  Gastrointestinal:  Positive for abdominal pain and vomiting.  Genitourinary:  Positive for flank pain. Negative for dysuria.  Musculoskeletal:  Positive for back pain.  Neurological:  Negative for weakness.  All other systems reviewed and are negative.  Physical Exam Updated Vital Signs BP (!) 142/98   Pulse (!) 48   Temp 98.1 F (36.7 C) (Oral)   Resp 16   Ht 1.829 m (6')   Wt 106.6 kg   SpO2 98%   BMI 31.87 kg/m   Physical Exam CONSTITUTIONAL: Well developed/well nourished HEAD: Normocephalic/atraumatic EYES: EOMI/PERRL ENMT: Mucous membranes moist NECK: supple no meningeal signs SPINE/BACK:entire spine nontender  CV: S1/S2 noted, no murmurs/rubs/gallops noted LUNGS: Lungs are clear to auscultation bilaterally, no apparent distress ABDOMEN:  soft, mild LLQ tenderness, no rebound or guarding GU: Left cva tenderness, no erythema, no bruising to flank NEURO: Awake/alert, equal motor 5/5 strength noted with the following: hip flexion/knee flexion/extension, foot dorsi/plantar flexion, great toe extension intact bilaterally, no sensory deficit in any dermatome.  Pt is able to ambulate unassisted. EXTREMITIES: pulses normal, full ROM SKIN: warm, color normal PSYCH: no abnormalities of mood noted, alert and oriented to situation  ED Results / Procedures / Treatments   Labs (all labs ordered  are listed, but only abnormal results are displayed) Labs Reviewed  BASIC METABOLIC PANEL - Abnormal; Notable for the following components:      Result Value   Creatinine, Ser 1.40 (*)    All other components within normal limits  URINALYSIS, ROUTINE W REFLEX MICROSCOPIC  CBC WITH DIFFERENTIAL/PLATELET    EKG None  Radiology CT ABDOMEN PELVIS WO CONTRAST  Result Date: 11/29/2020 CLINICAL DATA:  Low back pain, prior left nephrectomy EXAM: CT ABDOMEN AND PELVIS WITHOUT CONTRAST TECHNIQUE: Multidetector CT imaging of the abdomen and pelvis was performed following the standard protocol without IV contrast. COMPARISON:  09/27/2020 FINDINGS: Lower chest: Lung bases are clear. Hepatobiliary: Unenhanced liver is unremarkable. Gallbladder is decompressed. No intrahepatic or extrahepatic ductal dilatation. Pancreas: Within normal limits. Spleen: Within normal limits. Adrenals/Urinary Tract: Adrenal glands are within normal limits. Right kidney is within normal limits. No renal calculi or hydronephrosis. Status post left nephrectomy. No abnormal soft tissue in the surgical bed. Bladder is within normal limits. Stomach/Bowel: Stomach is within normal limits. No evidence of bowel obstruction. Prior appendectomy. No colonic wall thickening or inflammatory changes. Vascular/Lymphatic: No evidence of abdominal aortic aneurysm. No suspicious abdominopelvic lymphadenopathy. Reproductive: Prostate is unremarkable. Other: No abdominopelvic ascites. Mild postsurgical changes along the midline anterior abdominal wall. Musculoskeletal: Visualized osseous structures are within normal limits. IMPRESSION: Status post left nephrectomy. No evidence of recurrent or metastatic disease. Status post appendectomy. No CT findings to account for the patient's low back pain. Electronically Signed   By: Julian Hy M.D.   On: 11/29/2020 01:29    Procedures Procedures   Medications Ordered in ED Medications  fentaNYL  (SUBLIMAZE) injection 100 mcg (100 mcg Intravenous Given 11/29/20 0124)    ED Course  I have reviewed the triage vital signs and the nursing notes.  Pertinent labs & imaging results that were available during my care of the patient were reviewed by me and considered in my medical decision making (see chart for details).    MDM Rules/Calculators/A&P                           Patient presents with left flank pain/back pain.  He also reports lower abdominal pain. Patient with recent radical nephrectomy.  We will proceed with labs and CT imaging to evaluate for any acute abdominal/urologic emergency 2:09 AM Overall CT imaging is reassuring Personally reviewed the CT.  Patient appears comfortable, resting in no distress. We will treat as a musculoskeletal back strain.  He has had good response previously to Flexeril, this will be prescribed. Advised patient that his renal function is improving on labs Patient will be discharged home  Final Clinical Impression(s) / ED Diagnoses Final diagnoses:  Acute left-sided low back pain without sciatica  Left lower quadrant abdominal pain    Rx / DC Orders ED Discharge Orders          Ordered    cyclobenzaprine (FLEXERIL) 10 MG tablet  2 times  daily PRN        11/29/20 0206             Ripley Fraise, MD 11/29/20 0210

## 2020-12-06 ENCOUNTER — Telehealth: Payer: Self-pay

## 2020-12-06 NOTE — Telephone Encounter (Signed)
Called Patient regarding Disability Forms received from [place]. Patient stated that his Physician is Dr. Alexis Frock, Urologist. This Nurse had previously contacted [place] regarding forms received in error. Patient stated that he would contact his Representative at [facility] and have the forms forwarded to the correct office.

## 2020-12-19 ENCOUNTER — Telehealth: Payer: Self-pay

## 2020-12-19 NOTE — Telephone Encounter (Signed)
Called Patient regarding Disability Forms received from [place] once again. Patient stated that his Physician is Dr. Alexis Frock, Urologist. This Nurse had previously contacted [place] regarding forms received in error. Patient stated that he would once again contact his Representative at [facility] and have the forms forwarded to the correct office.

## 2021-03-18 ENCOUNTER — Other Ambulatory Visit: Payer: Self-pay

## 2021-03-18 ENCOUNTER — Emergency Department (HOSPITAL_BASED_OUTPATIENT_CLINIC_OR_DEPARTMENT_OTHER)
Admission: EM | Admit: 2021-03-18 | Discharge: 2021-03-18 | Disposition: A | Discharge status: Home / Self Care | Payer: Self-pay | Attending: Emergency Medicine | Admitting: Emergency Medicine

## 2021-03-18 ENCOUNTER — Encounter (HOSPITAL_BASED_OUTPATIENT_CLINIC_OR_DEPARTMENT_OTHER): Payer: Self-pay | Admitting: *Deleted

## 2021-03-18 ENCOUNTER — Emergency Department (HOSPITAL_BASED_OUTPATIENT_CLINIC_OR_DEPARTMENT_OTHER): Payer: Self-pay

## 2021-03-18 DIAGNOSIS — R7989 Other specified abnormal findings of blood chemistry: Secondary | ICD-10-CM | POA: Insufficient documentation

## 2021-03-18 DIAGNOSIS — R109 Unspecified abdominal pain: Secondary | ICD-10-CM | POA: Insufficient documentation

## 2021-03-18 DIAGNOSIS — Z85528 Personal history of other malignant neoplasm of kidney: Secondary | ICD-10-CM | POA: Insufficient documentation

## 2021-03-18 DIAGNOSIS — R200 Anesthesia of skin: Secondary | ICD-10-CM | POA: Insufficient documentation

## 2021-03-18 LAB — CBC
HCT: 41.9 % (ref 39.0–52.0)
Hemoglobin: 14.6 g/dL (ref 13.0–17.0)
MCH: 32.3 pg (ref 26.0–34.0)
MCHC: 34.8 g/dL (ref 30.0–36.0)
MCV: 92.7 fL (ref 80.0–100.0)
Platelets: 263 10*3/uL (ref 150–400)
RBC: 4.52 MIL/uL (ref 4.22–5.81)
RDW: 12.5 % (ref 11.5–15.5)
WBC: 7.7 10*3/uL (ref 4.0–10.5)
nRBC: 0 % (ref 0.0–0.2)

## 2021-03-18 LAB — BASIC METABOLIC PANEL
Anion gap: 6 (ref 5–15)
BUN: 12 mg/dL (ref 6–20)
CO2: 28 mmol/L (ref 22–32)
Calcium: 9 mg/dL (ref 8.9–10.3)
Chloride: 105 mmol/L (ref 98–111)
Creatinine, Ser: 1.69 mg/dL — ABNORMAL HIGH (ref 0.61–1.24)
GFR, Estimated: 53 mL/min — ABNORMAL LOW (ref >60)
Glucose, Bld: 105 mg/dL — ABNORMAL HIGH (ref 70–99)
Potassium: 4 mmol/L (ref 3.5–5.1)
Sodium: 139 mmol/L (ref 135–145)

## 2021-03-18 LAB — URINALYSIS, ROUTINE W REFLEX MICROSCOPIC
Bilirubin Urine: NEGATIVE
Glucose, UA: NEGATIVE mg/dL
Hgb urine dipstick: NEGATIVE
Ketones, ur: NEGATIVE mg/dL
Leukocytes,Ua: NEGATIVE
Nitrite: NEGATIVE
Protein, ur: NEGATIVE mg/dL
Specific Gravity, Urine: 1.025 (ref 1.005–1.030)
pH: 7 (ref 5.0–8.0)

## 2021-03-18 MED ORDER — CYCLOBENZAPRINE HCL 10 MG PO TABS: 10.0000 mg | ORAL_TABLET | Freq: Two times a day (BID) | ORAL | 0 refills | Status: AC | PRN

## 2021-03-18 MED ORDER — KETOROLAC TROMETHAMINE 30 MG/ML IJ SOLN
30.0000 mg | Freq: Once | INTRAMUSCULAR | Status: AC
  Administered 2021-03-18: 30 mg via INTRAVENOUS
  Filled 2021-03-18: qty 1

## 2021-03-18 MED ORDER — NAPROXEN 375 MG PO TABS: 375.0000 mg | ORAL_TABLET | Freq: Two times a day (BID) | ORAL | 0 refills | Status: AC

## 2021-03-18 NOTE — ED Provider Notes (Signed)
[facility] Provider Note   CSN: [id number] Arrival date & time: 03/18/21  1546     History  Chief Complaint  Patient presents with   Flank Pain    Rick Thomas is a 36 y.o. male.   Flank Pain   Patient presents ED with complaints of flank pain.  Patient states he has a history of kidney stones but also renal carcinoma status post nephrectomy.  Patient states he has been having issues with flank pain off and on since the nephrectomy.  However recently he started having increasing pain that is very tender to the touch.  It increases with palpation and certain movements.  Patient also has noticed some intermittent numbness in his left leg.  Patient was concerned about the persistent pain that he could have recurrence of the cancer or some other issue going on.  He is not having any fevers or chills.  No vomiting or diarrhea.  No dysuria.  Home Medications Prior to Admission medications   Medication Sig Start Date End Date Taking? Authorizing Provider  naproxen (NAPROSYN) 375 MG tablet Take 1 tablet (375 mg total) by mouth 2 (two) times daily. 03/18/21  Yes Dorie Rank, MD  cyclobenzaprine (FLEXERIL) 10 MG tablet Take 1 tablet (10 mg total) by mouth 2 (two) times daily as needed for muscle spasms. 03/18/21   Dorie Rank, MD      Allergies    Amoxicillin and Penicillins    Review of Systems   Review of Systems  Constitutional:  Negative for fever.  Genitourinary:  Positive for flank pain.   Physical Exam Updated Vital Signs BP (!) 163/88 (BP Location: Right Arm)    Pulse 74    Temp 98.6 F (37 C) (Oral)    Resp 18    Ht 1.829 m (6')    Wt 107 kg    SpO2 95%    BMI 32.01 kg/m  Physical Exam Vitals and nursing note reviewed.  Constitutional:      General: He is not in acute distress.    Appearance: He is well-developed.  HENT:     Head: Normocephalic and atraumatic.     Right Ear: External ear normal.     Left Ear: External ear normal.  Eyes:      General: No scleral icterus.       Right eye: No discharge.        Left eye: No discharge.     Conjunctiva/sclera: Conjunctivae normal.  Neck:     Trachea: No tracheal deviation.  Cardiovascular:     Rate and Rhythm: Normal rate and regular rhythm.  Pulmonary:     Effort: Pulmonary effort is normal. No respiratory distress.     Breath sounds: Normal breath sounds. No stridor. No wheezing or rales.  Abdominal:     General: Bowel sounds are normal. There is no distension.     Palpations: Abdomen is soft.     Tenderness: There is no abdominal tenderness. There is left CVA tenderness. There is no guarding or rebound.  Musculoskeletal:        General: No tenderness or deformity.     Cervical back: Neck supple.  Skin:    General: Skin is warm and dry.     Findings: No rash.  Neurological:     General: No focal deficit present.     Mental Status: He is alert.     Cranial Nerves: No cranial nerve deficit (no facial droop, extraocular movements intact, no  slurred speech).     Sensory: No sensory deficit.     Motor: No abnormal muscle tone or seizure activity.     Coordination: Coordination normal.     Comments: Normal strength and sensation bilateral lower extremities  Psychiatric:        Mood and Affect: Mood normal.    ED Results / Procedures / Treatments   Labs (all labs ordered are listed, but only abnormal results are displayed) Labs Reviewed  BASIC METABOLIC PANEL - Abnormal; Notable for the following components:      Result Value   Glucose, Bld 105 (*)    Creatinine, Ser 1.69 (*)    GFR, Estimated 53 (*)    All other components within normal limits  CBC  URINALYSIS, ROUTINE W REFLEX MICROSCOPIC    EKG None  Radiology CT Renal Stone Study  Result Date: 03/18/2021 CLINICAL DATA:  Flank pain, hx nephrectomy, cancer EXAM: CT ABDOMEN AND PELVIS WITHOUT CONTRAST TECHNIQUE: Multidetector CT imaging of the abdomen and pelvis was performed following the standard  protocol without IV contrast. RADIATION DOSE REDUCTION: This exam was performed according to the departmental dose-optimization program which includes automated exposure control, adjustment of the mA and/or kV according to patient size and/or use of iterative reconstruction technique. COMPARISON:  November 29, 2020 FINDINGS: Lower chest: No acute abnormality. Hepatobiliary: Punctate calcified granuloma right lobe. Liver otherwise unremarkable. Gallbladder is unremarkable. No biliary dilatation. Pancreas: Unremarkable. Spleen: Unremarkable. Adrenals/Urinary Tract: Right adrenal unremarkable. Left adrenal may be surgically absent. Left nephrectomy. No abnormality identified in the nephrectomy bed. Right kidney is unremarkable. Partially distended bladder is unremarkable. Stomach/Bowel: Stomach is within normal limits. Bowel is normal in caliber. Appendectomy. Vascular/Lymphatic: No significant vascular abnormality. No enlarged nodes. Reproductive: Unremarkable. Other: No free fluid.  Abdominal wall is unremarkable. Musculoskeletal: No acute osseous abnormality. IMPRESSION: No acute abnormality. Electronically Signed   By: Macy Mis M.D.   On: 03/18/2021 16:50    Procedures Procedures    Medications Ordered in ED Medications  ketorolac (TORADOL) 30 MG/ML injection 30 mg (has no administration in time range)    ED Course/ Medical Decision Making/ A&P Clinical Course as of 03/18/21 1731  Sat Mar 18, 2021  1711 CBC Normal [JK]  1711 Urinalysis, Routine w reflex microscopic Urine, Clean Catch Normal [JK]  5621 Basic metabolic panel(!) Creatinine increased, similar to previous values [JK]  1712 CT Renal Stone Study No acute findings [JK]    Clinical Course User Index [JK] Dorie Rank, MD                           Medical Decision Making Amount and/or Complexity of Data Reviewed Labs: ordered. Decision-making details documented in ED Course. Radiology: ordered. Decision-making details  documented in ED Course.  Risk Prescription drug management.   Patient presented with complaints of left flank pain.  History of renal cell carcinoma.  Some complaints of pain rating down his leg but no focal numbness or weakness.  ED evaluation reassuring.  No anemia.  No signs of infection.  CT scan does not show any recurrence of his malignancy or other acute abnormality.  Is possible pain may be musculoskeletal in nature.  Will discharge home with medications for pain.  Discussed outpatient follow-up with a primary care doctor for further evaluation.        Final Clinical Impression(s) / ED Diagnoses Final diagnoses:  Left flank pain    Rx / DC Orders ED Discharge  Orders          Ordered    naproxen (NAPROSYN) 375 MG tablet  2 times daily        03/18/21 1730    cyclobenzaprine (FLEXERIL) 10 MG tablet  2 times daily PRN        03/18/21 1730              Dorie Rank, MD 03/18/21 1731

## 2021-03-18 NOTE — ED Triage Notes (Signed)
Pt reports left flank pain x 1 week. States he had kidney surgery 6 months ago on the same side and this pain is similar. Denies hematuria. Also reports transient numbness in right hand and leg over the last 2 days

## 2021-03-18 NOTE — Discharge Instructions (Addendum)
Take the medications as needed for pain.  Consider following up with a primary care doctor for further evaluation as we discussed

## 2023-01-21 IMAGING — CT CT ABD-PELV W/O CM
2 of 4 series · 16 of 46 positions shown, 18 images · non-contrast
Comparison: 09/27/2020

CLINICAL DATA: Low back pain, prior left nephrectomy

EXAM:
CT ABDOMEN AND PELVIS WITHOUT CONTRAST
TECHNIQUE: Multidetector CT imaging of the abdomen and pelvis was performed
following the standard protocol without IV contrast.

[Series 2: abd pel wo · axial · 0.86mm/px · z∈[-669,-189]mm · 13 of 106 slices shown, 15 images]
[im 5/106  soft-tissue]
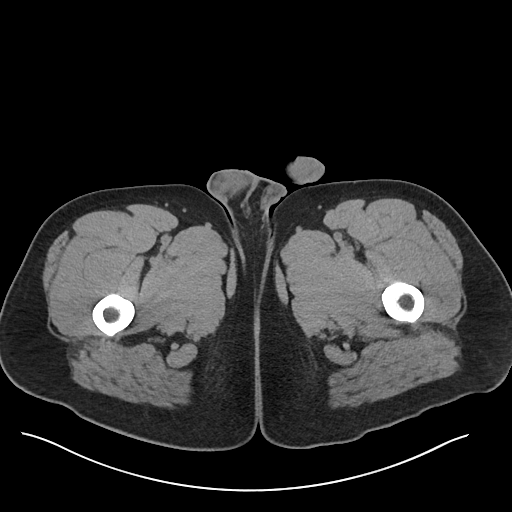
[im 5/106  bone]
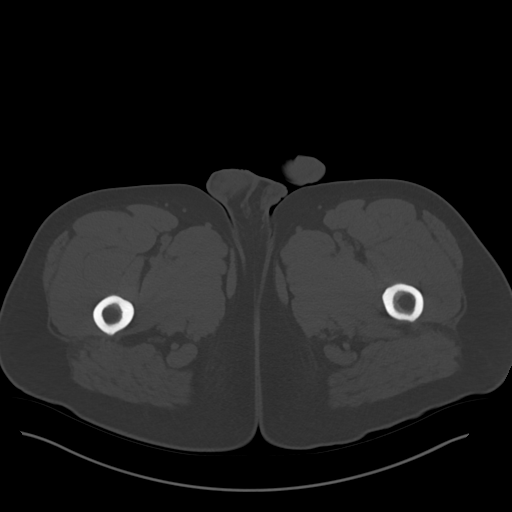
[im 14/106  soft-tissue]
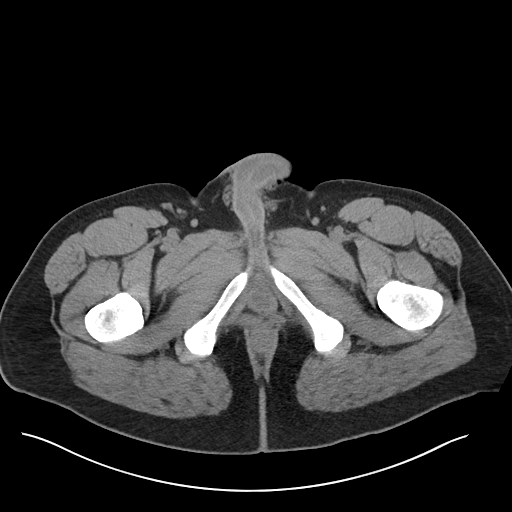
[im 23/106  soft-tissue]
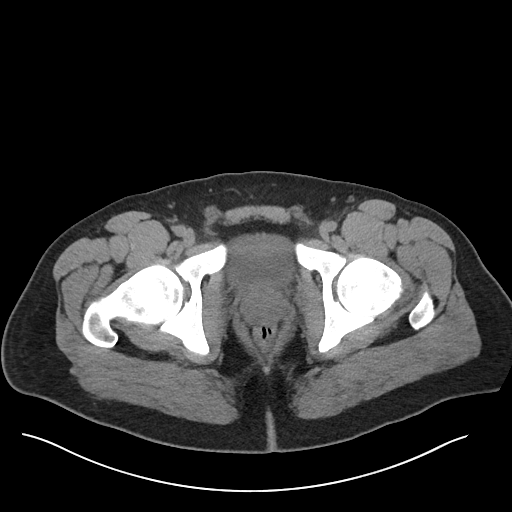
[im 28/106  soft-tissue]
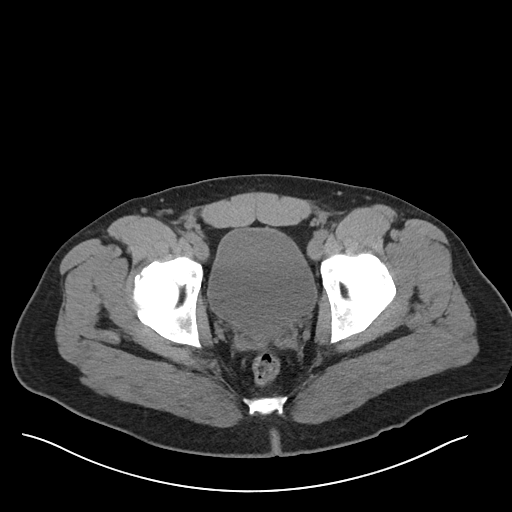
[im 37/106  soft-tissue]
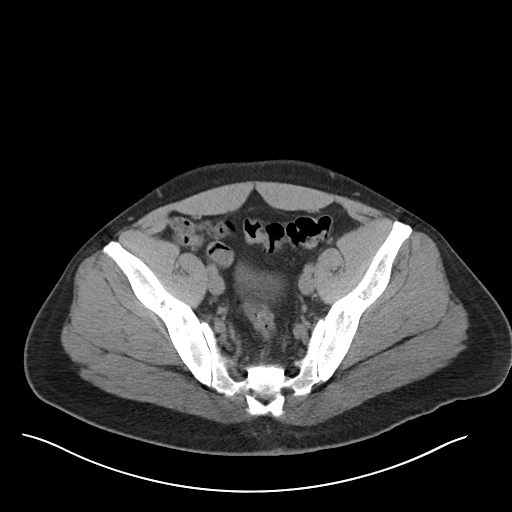
[im 46/106  soft-tissue]
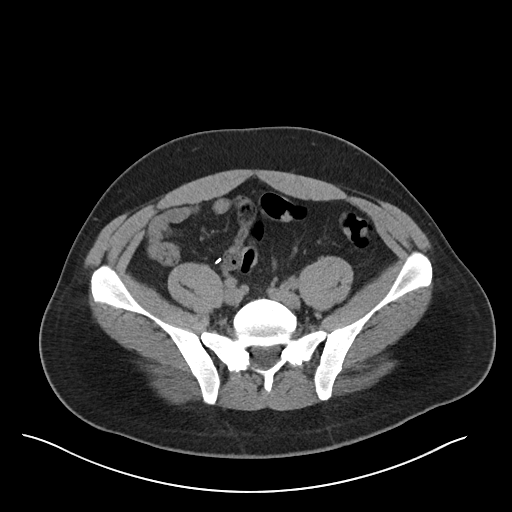
[im 55/106  soft-tissue]
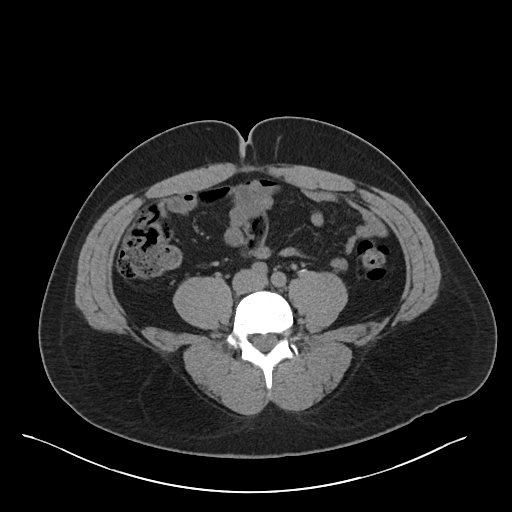
[im 60/106  soft-tissue]
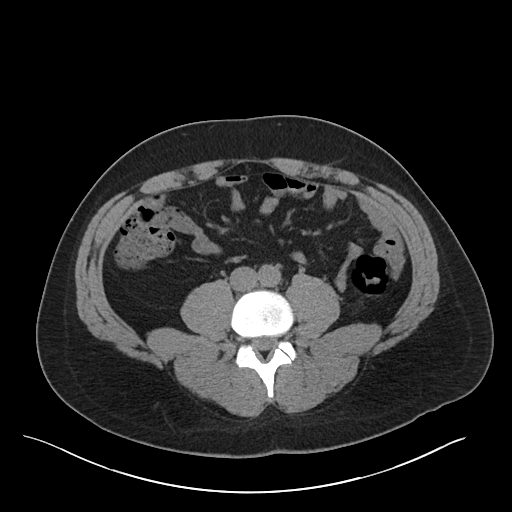
[im 69/106  soft-tissue]
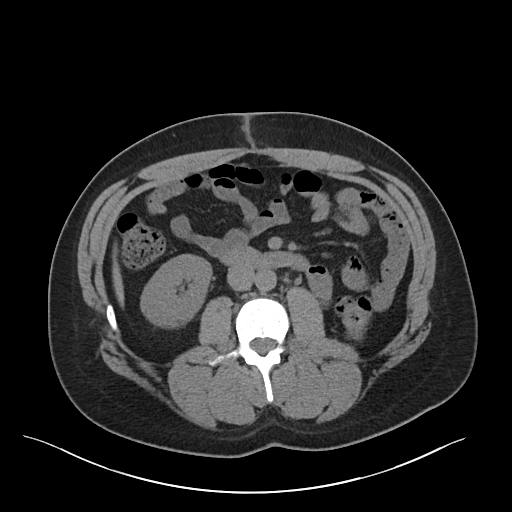
[im 69/106  bone]
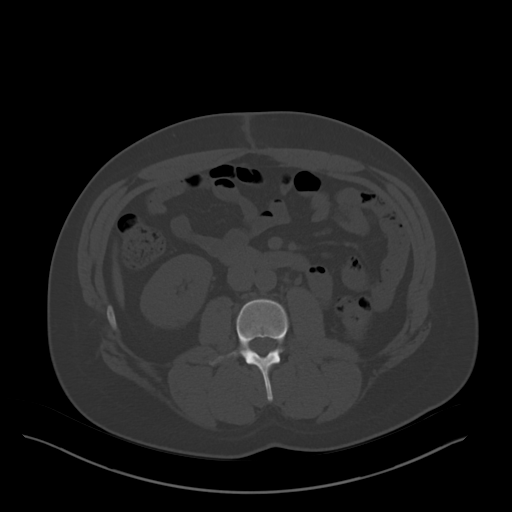
[im 78/106  soft-tissue]
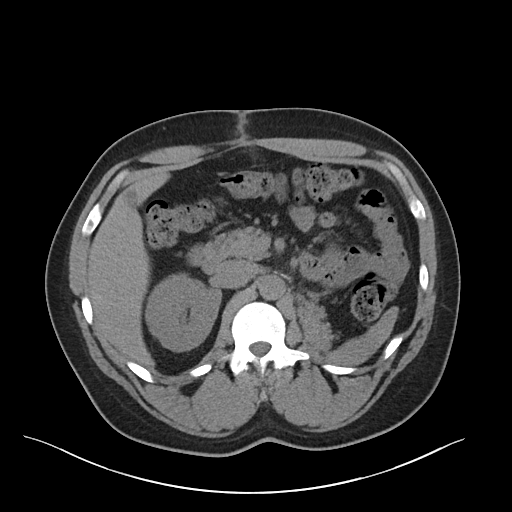
[im 83/106  soft-tissue]
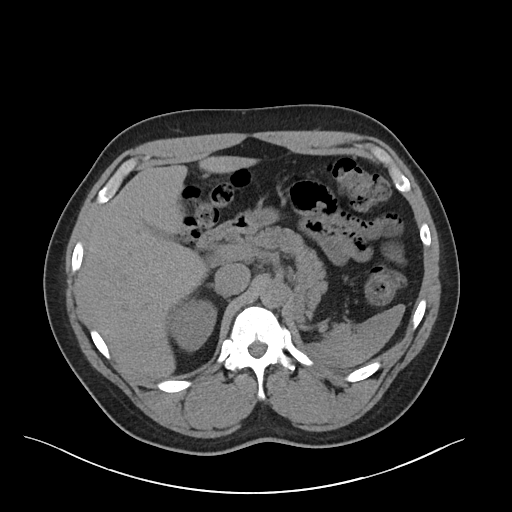
[im 92/106  soft-tissue]
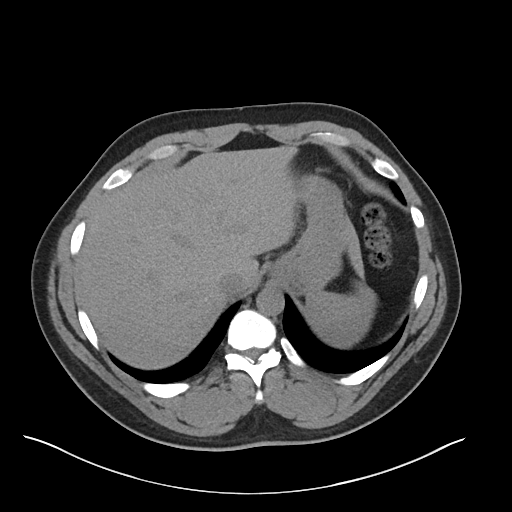
[im 101/106  soft-tissue]
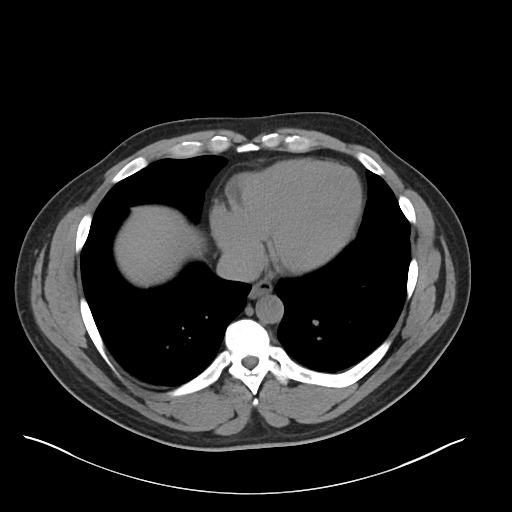

[Series 5: coronal · coronal · 0.89mm/px · 3 of 100 slices shown]
[im 34/100  soft-tissue]
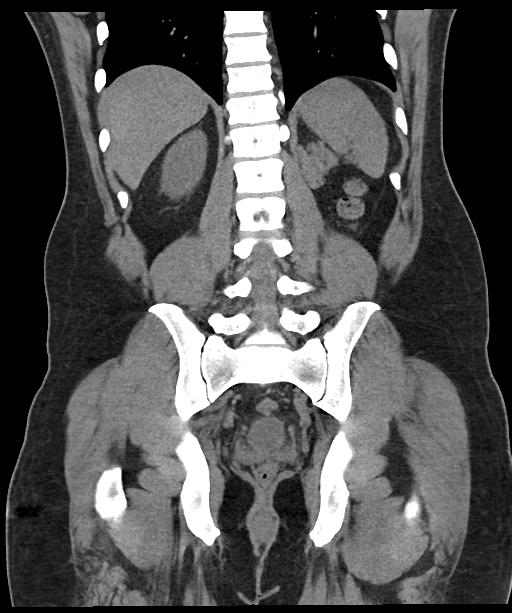
[im 45/100  soft-tissue]
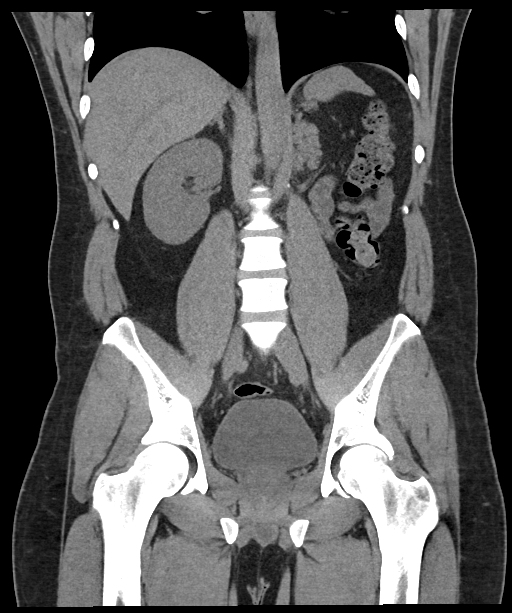
[im 56/100  soft-tissue]
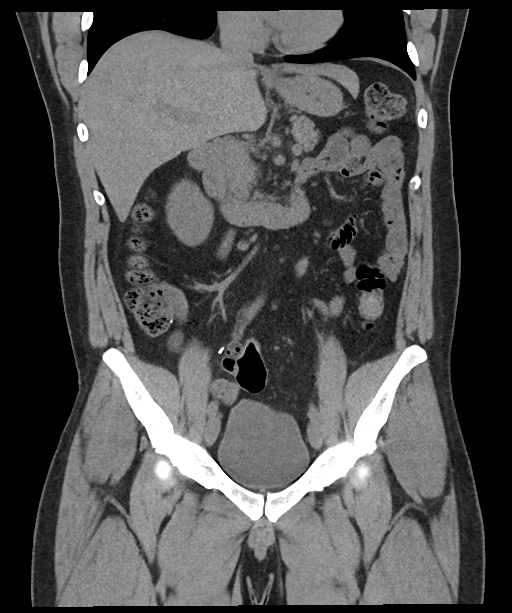

[16 of 46 positions shown; findings below may reference images not displayed]

FINDINGS: Lower chest: Lung bases are clear.

Hepatobiliary: Unenhanced liver is unremarkable.

Gallbladder is decompressed. No intrahepatic or extrahepatic ductal
dilatation.

Pancreas: Within normal limits.

Spleen: Within normal limits.

Adrenals/Urinary Tract: Adrenal glands are within normal limits.

Right kidney is within normal limits. No renal calculi or
hydronephrosis.

Status post left nephrectomy. No abnormal soft tissue in the
surgical bed.

Bladder is within normal limits.

Stomach/Bowel: Stomach is within normal limits.

No evidence of bowel obstruction.

Prior appendectomy.

No colonic wall thickening or inflammatory changes.

Vascular/Lymphatic: No evidence of abdominal aortic aneurysm.

No suspicious abdominopelvic lymphadenopathy.

Reproductive: Prostate is unremarkable.

Other: No abdominopelvic ascites.

Mild postsurgical changes along the midline anterior abdominal wall.

Musculoskeletal: Visualized osseous structures are within normal
limits.
IMPRESSION: Status post left nephrectomy. No evidence of recurrent or metastatic
disease.

Status post appendectomy.

No CT findings to account for the patient's low back pain.

## 2023-05-10 IMAGING — CT CT RENAL STONE PROTOCOL
2 of 4 series · 16 of 46 positions shown, 18 images · non-contrast
Comparison: November 29, 2020

CLINICAL DATA: Flank pain, hx nephrectomy, cancer



[Series 2: axial st · axial · 0.94mm/px · z∈[+520,+990]mm · 13 of 104 slices shown, 15 images]
[im 5/104  soft-tissue]
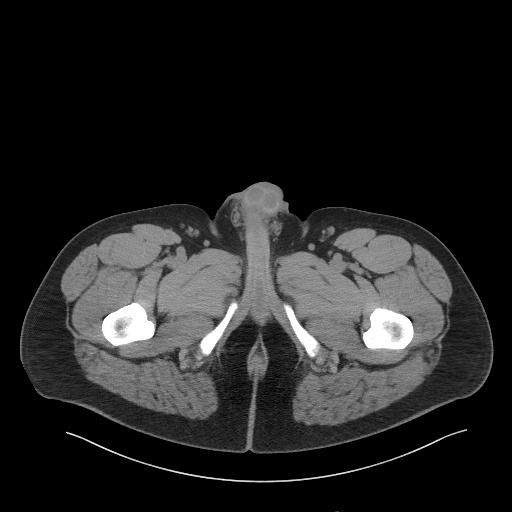
[im 5/104  bone]
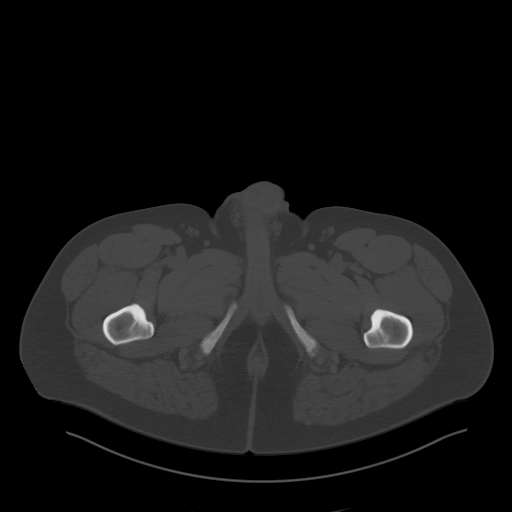
[im 13/104  soft-tissue]
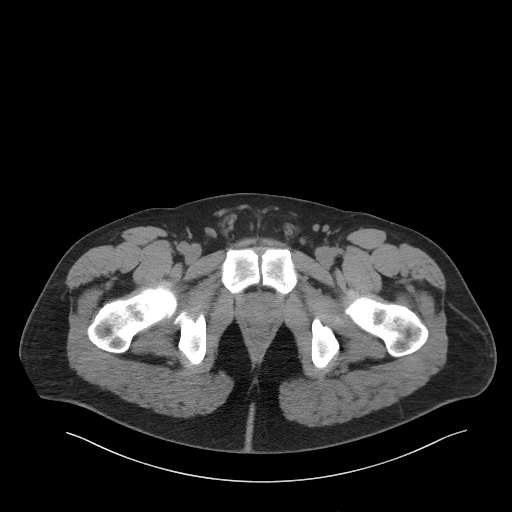
[im 22/104  soft-tissue]
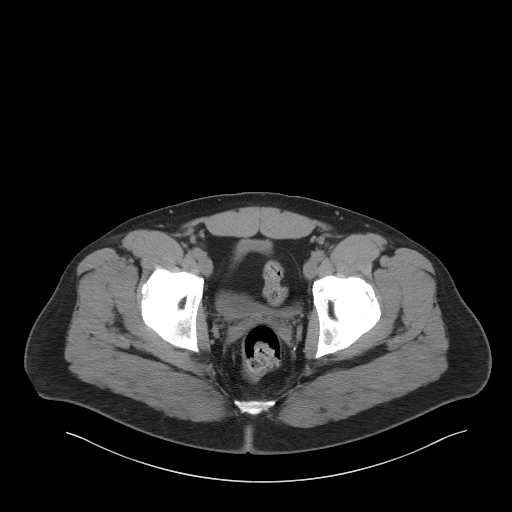
[im 31/104  soft-tissue]
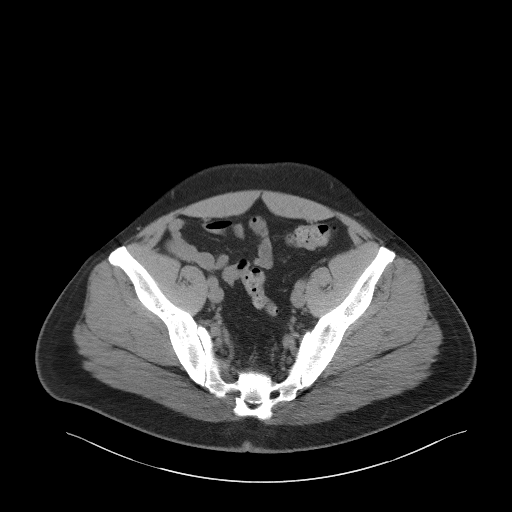
[im 35/104  soft-tissue]
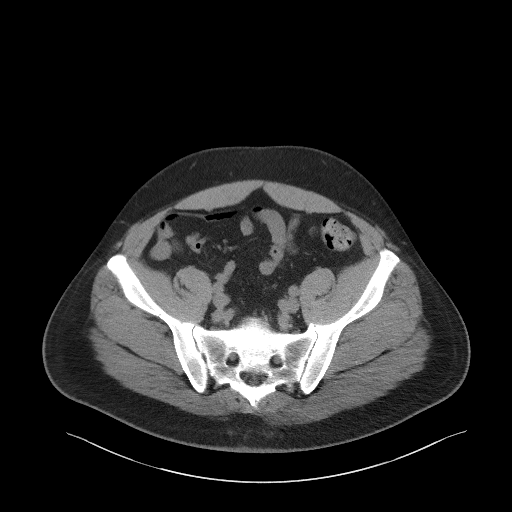
[im 43/104  soft-tissue]
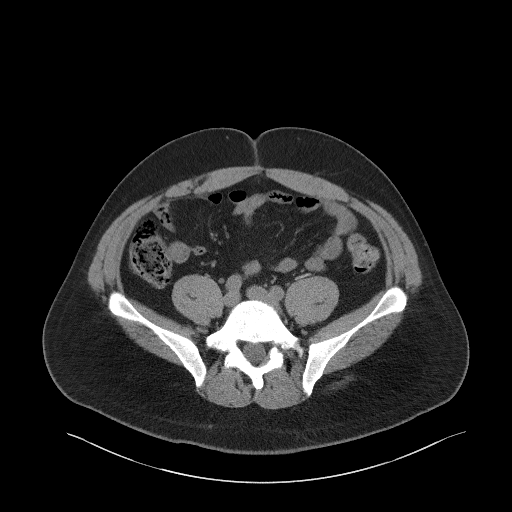
[im 52/104  soft-tissue]
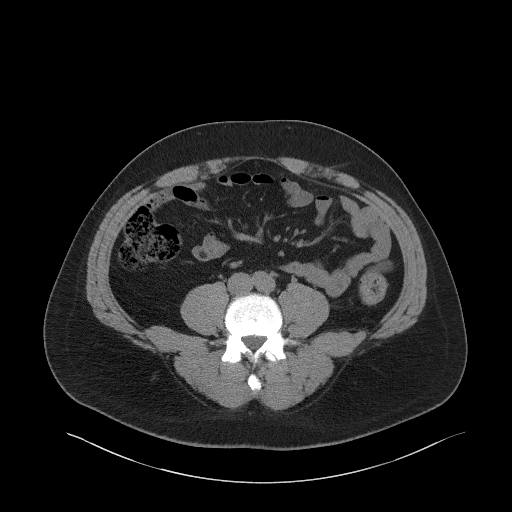
[im 61/104  soft-tissue]
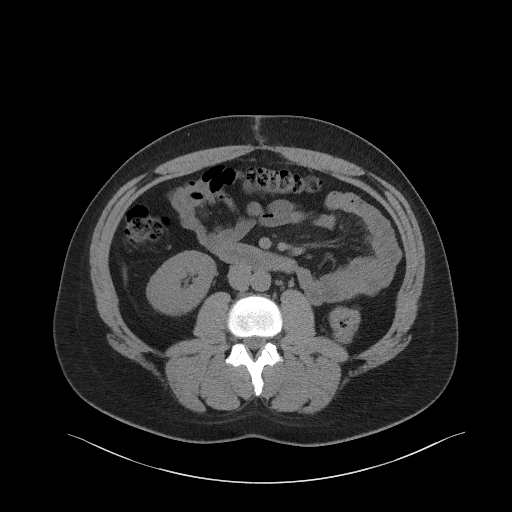
[im 69/104  soft-tissue]
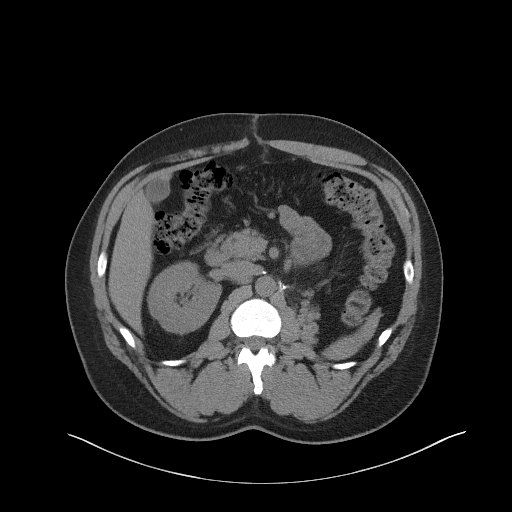
[im 69/104  bone]
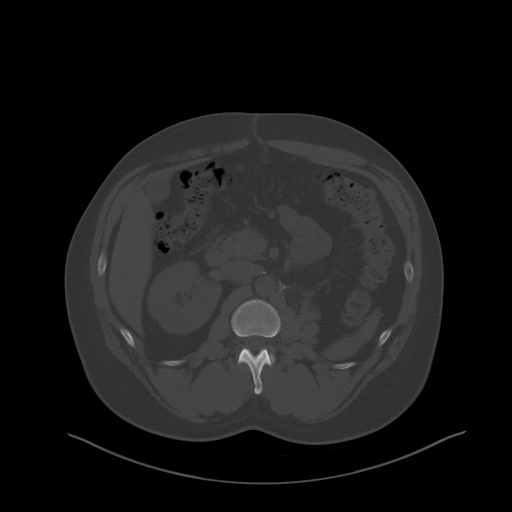
[im 73/104  soft-tissue]
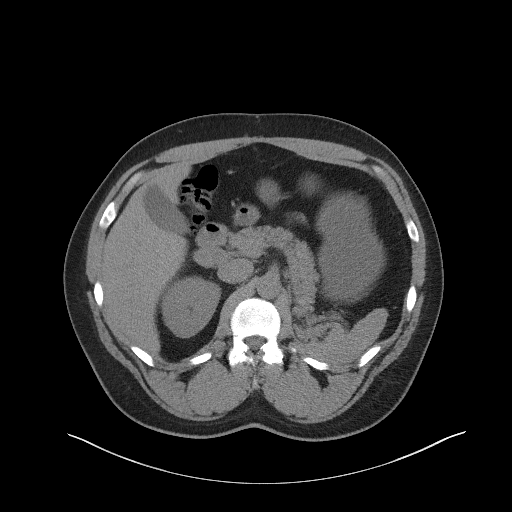
[im 82/104  soft-tissue]
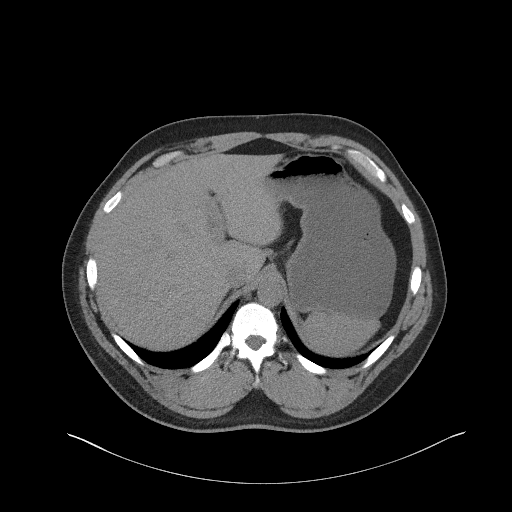
[im 91/104  soft-tissue]
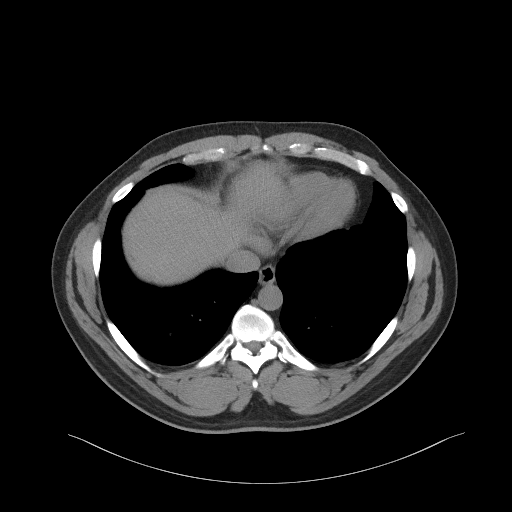
[im 99/104  soft-tissue]
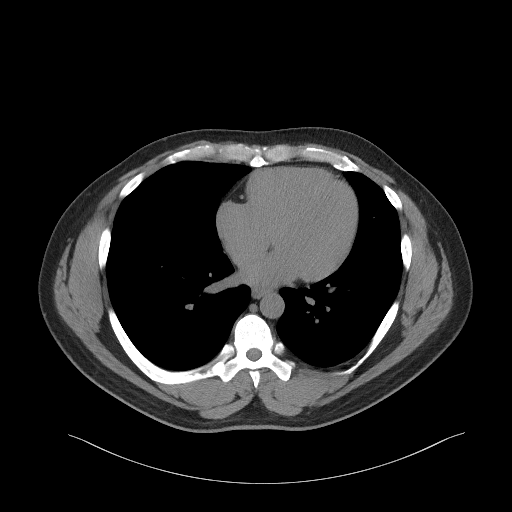

[Series 5: coronal st · coronal · 0.74mm/px · 3 of 109 slices shown]
[im 37/109  soft-tissue]
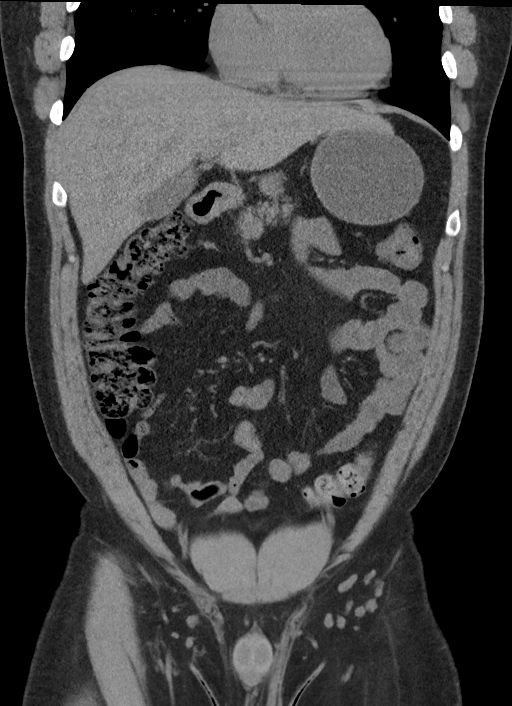
[im 49/109  soft-tissue]
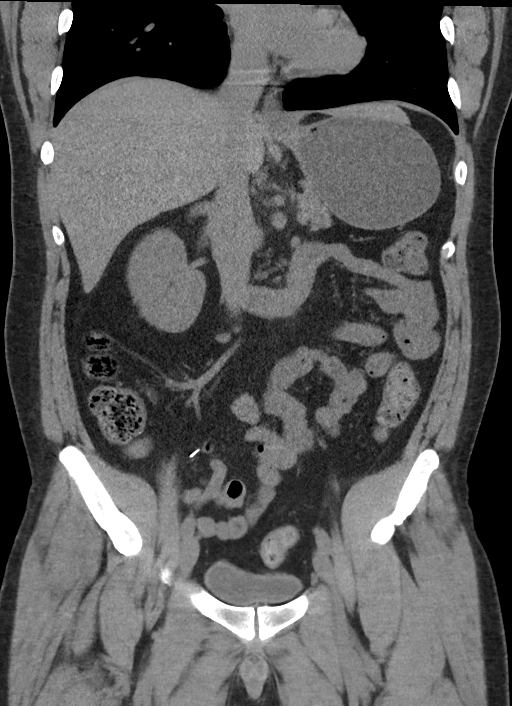
[im 61/109  soft-tissue]
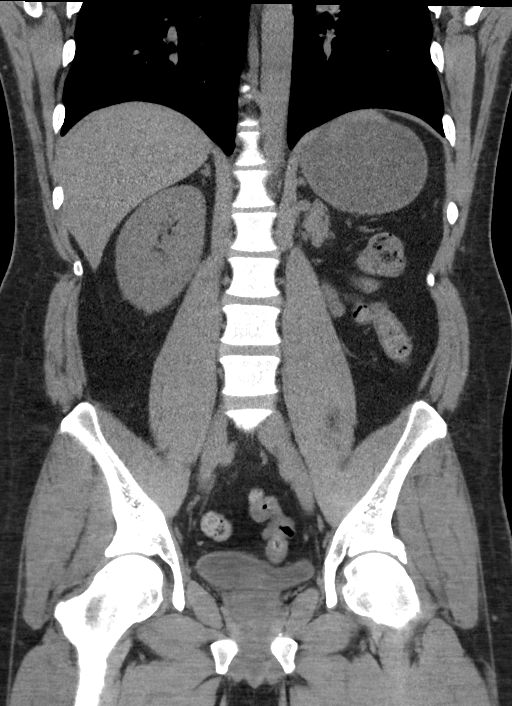

[16 of 46 positions shown; findings below may reference images not displayed]

FINDINGS: Lower chest: No acute abnormality.

Hepatobiliary: Punctate calcified granuloma right lobe. Liver
otherwise unremarkable. Gallbladder is unremarkable. No biliary
dilatation.

Pancreas: Unremarkable.

Spleen: Unremarkable.

Adrenals/Urinary Tract: Right adrenal unremarkable. Left adrenal may
be surgically absent. Left nephrectomy. No abnormality identified in
the nephrectomy bed. Right kidney is unremarkable. Partially
distended bladder is unremarkable.

Stomach/Bowel: Stomach is within normal limits. Bowel is normal in
caliber. Appendectomy.

Vascular/Lymphatic: No significant vascular abnormality. No enlarged
nodes.

Reproductive: Unremarkable.

Other: No free fluid.  Abdominal wall is unremarkable.

Musculoskeletal: No acute osseous abnormality.
IMPRESSION: No acute abnormality.
